# Patient Record
Sex: Male | Born: 1970 | Race: White | Hispanic: No | Marital: Married | Smoking: Never smoker
Health system: Southern US, Community
[De-identification: ages and names within clinical notes are randomized; demographics above are authoritative.]

## PROBLEM LIST (undated history)

## (undated) DIAGNOSIS — E785 Hyperlipidemia, unspecified: Secondary | ICD-10-CM

## (undated) DIAGNOSIS — I10 Essential (primary) hypertension: Secondary | ICD-10-CM

## (undated) DIAGNOSIS — E119 Type 2 diabetes mellitus without complications: Secondary | ICD-10-CM

---

## 2005-01-08 ENCOUNTER — Ambulatory Visit: Payer: Self-pay | Admitting: Family Medicine

## 2005-01-15 ENCOUNTER — Ambulatory Visit: Payer: Self-pay | Admitting: Family Medicine

## 2021-12-20 ENCOUNTER — Other Ambulatory Visit: Payer: Self-pay

## 2021-12-20 ENCOUNTER — Encounter (HOSPITAL_BASED_OUTPATIENT_CLINIC_OR_DEPARTMENT_OTHER): Payer: Self-pay

## 2021-12-20 ENCOUNTER — Emergency Department (HOSPITAL_BASED_OUTPATIENT_CLINIC_OR_DEPARTMENT_OTHER): Payer: BC Managed Care – PPO

## 2021-12-20 ENCOUNTER — Observation Stay (HOSPITAL_BASED_OUTPATIENT_CLINIC_OR_DEPARTMENT_OTHER)
Admission: EM | Admit: 2021-12-20 | Discharge: 2021-12-21 | Disposition: A | Discharge status: Home / Self Care | Payer: BC Managed Care – PPO | Attending: Internal Medicine | Admitting: Internal Medicine

## 2021-12-20 DIAGNOSIS — E785 Hyperlipidemia, unspecified: Secondary | ICD-10-CM | POA: Diagnosis not present

## 2021-12-20 DIAGNOSIS — I639 Cerebral infarction, unspecified: Principal | ICD-10-CM | POA: Insufficient documentation

## 2021-12-20 DIAGNOSIS — E1169 Type 2 diabetes mellitus with other specified complication: Secondary | ICD-10-CM | POA: Insufficient documentation

## 2021-12-20 DIAGNOSIS — I10 Essential (primary) hypertension: Secondary | ICD-10-CM | POA: Insufficient documentation

## 2021-12-20 DIAGNOSIS — R200 Anesthesia of skin: Secondary | ICD-10-CM | POA: Diagnosis present

## 2021-12-20 DIAGNOSIS — E1159 Type 2 diabetes mellitus with other circulatory complications: Secondary | ICD-10-CM | POA: Diagnosis present

## 2021-12-20 DIAGNOSIS — E119 Type 2 diabetes mellitus without complications: Secondary | ICD-10-CM

## 2021-12-20 HISTORY — DX: Essential (primary) hypertension: I10

## 2021-12-20 HISTORY — DX: Hyperlipidemia, unspecified: E78.5

## 2021-12-20 HISTORY — DX: Type 2 diabetes mellitus without complications: E11.9

## 2021-12-20 LAB — ETHANOL: Alcohol, Ethyl (B): <10 mg/dL (ref <10)

## 2021-12-20 LAB — COMPREHENSIVE METABOLIC PANEL
ALT: 55 U/L — ABNORMAL HIGH (ref 0–44)
AST: 32 U/L (ref 15–41)
Albumin: 4.2 g/dL (ref 3.5–5.0)
Alkaline Phosphatase: 54 U/L (ref 38–126)
Anion gap: 8 (ref 5–15)
BUN: 17 mg/dL (ref 6–20)
CO2: 23 mmol/L (ref 22–32)
Calcium: 9.3 mg/dL (ref 8.9–10.3)
Chloride: 103 mmol/L (ref 98–111)
Creatinine, Ser: 0.79 mg/dL (ref 0.61–1.24)
GFR, Estimated: >60 mL/min (ref >60)
Glucose, Bld: 150 mg/dL — ABNORMAL HIGH (ref 70–99)
Potassium: 4.1 mmol/L (ref 3.5–5.1)
Sodium: 134 mmol/L — ABNORMAL LOW (ref 135–145)
Total Bilirubin: 1 mg/dL (ref 0.3–1.2)
Total Protein: 6.9 g/dL (ref 6.5–8.1)

## 2021-12-20 LAB — URINALYSIS, ROUTINE W REFLEX MICROSCOPIC
Bilirubin Urine: NEGATIVE
Glucose, UA: 250 mg/dL — AB
Hgb urine dipstick: NEGATIVE
Ketones, ur: NEGATIVE mg/dL
Leukocytes,Ua: NEGATIVE
Nitrite: NEGATIVE
Protein, ur: NEGATIVE mg/dL
Specific Gravity, Urine: 1.02 (ref 1.005–1.030)
pH: 5.5 (ref 5.0–8.0)

## 2021-12-20 LAB — PROTIME-INR
INR: 1.1 (ref 0.8–1.2)
Prothrombin Time: 13.9 seconds (ref 11.4–15.2)

## 2021-12-20 LAB — DIFFERENTIAL
Abs Immature Granulocytes: 0.01 10*3/uL (ref 0.00–0.07)
Basophils Absolute: 0 10*3/uL (ref 0.0–0.1)
Basophils Relative: 1 %
Eosinophils Absolute: 0.2 10*3/uL (ref 0.0–0.5)
Eosinophils Relative: 3 %
Immature Granulocytes: 0 %
Lymphocytes Relative: 18 %
Lymphs Abs: 1 10*3/uL (ref 0.7–4.0)
Monocytes Absolute: 0.3 10*3/uL (ref 0.1–1.0)
Monocytes Relative: 6 %
Neutro Abs: 3.9 10*3/uL (ref 1.7–7.7)
Neutrophils Relative %: 72 %
Smear Review: NORMAL

## 2021-12-20 LAB — RAPID URINE DRUG SCREEN, HOSP PERFORMED
Amphetamines: NOT DETECTED
Barbiturates: NOT DETECTED
Benzodiazepines: NOT DETECTED
Cocaine: NOT DETECTED
Opiates: NOT DETECTED
Tetrahydrocannabinol: NOT DETECTED

## 2021-12-20 LAB — CBC
Hemoglobin: 16.5 g/dL (ref 13.0–17.0)
Platelets: 157 10*3/uL (ref 150–400)
WBC: 5.4 10*3/uL (ref 4.0–10.5)

## 2021-12-20 LAB — APTT: aPTT: 29 seconds (ref 24–36)

## 2021-12-20 LAB — CBG MONITORING, ED: Glucose-Capillary: 140 mg/dL — ABNORMAL HIGH (ref 70–99)

## 2021-12-20 MED ORDER — ASPIRIN 325 MG PO TABS
325.0000 mg | ORAL_TABLET | Freq: Every day | ORAL | Status: DC
  Administered 2021-12-20: 325 mg via ORAL
  Filled 2021-12-20: qty 1

## 2021-12-20 MED ORDER — CLOPIDOGREL BISULFATE 75 MG PO TABS
75.0000 mg | ORAL_TABLET | Freq: Once | ORAL | Status: AC
  Administered 2021-12-20: 75 mg via ORAL
  Filled 2021-12-20: qty 1

## 2021-12-20 NOTE — Progress Notes (Signed)
Pt arrived to the unit via stretcher with two carelink staff. No distress noted. Oral membranes are pink and moist. Pupils are PERRLA. Skin warm and dry. Respirations are even and non-labored. S1 and s2 noted. Peripheral pulses are +2.Abdomen soft, non-tender. Bowel sounds noted. Move all extremities without difficulty. Steady gait noted. Bed in lowest position. Call light within reach. ?

## 2021-12-20 NOTE — ED Triage Notes (Signed)
C/o right sided facial and arm tingling starting Sunday. Been increasingly fatigued. States slipped outside and fell on to left shoulder on Sunday prior to when tingling started. Also states when he drinks, fluid comes out of mouth, denies trouble swallowing.  ?

## 2021-12-20 NOTE — ED Notes (Signed)
[facility] Resource Line called to request phone consult for ED MD from [facility] ?

## 2021-12-20 NOTE — Progress Notes (Signed)
[facility] hospitalist paged regarding pt's arriival to the unit. ?

## 2021-12-20 NOTE — Progress Notes (Signed)
Plan of Care Note for accepted transfer ? ? ?Patient: Riley Shea MRN: [id number]   DOA: 12/20/2021 ? ?Facility requesting transfer: [facility] ED ?Requesting Provider: Dr. Regenia Skeeter, Nicki Reaper ?Reason for transfer: higher level of care ?Facility course: stroke workup ? ?Plan of care: ?The patient is accepted for admission to Telemetry unit, at [facility]..  ?Patient is a 51 years old man with a PMH of T2DM, HTN, HLD who presented with facial droop x 2 days and right arm numbness today.  No other neurological symptoms. VSS. ETOH negative.  Head CT negative for acute changes. ED spoke to neurology Dr. Quinn Axe who recommended to transfer to [facility], give ASA and Plavix. Patient is accepted to [facility] service, med tele inpatient. Please consult neurology when patient arrives.  ? ?Author: ?Charlann Lange, MD ?12/20/2021 ? ?Check [email or web address] for on-call coverage. ? ?Nursing staff, Please call [facility] number on Amion as soon as patient's arrival, so appropriate admitting provider can evaluate the pt. ?

## 2021-12-20 NOTE — ED Notes (Signed)
Report given to [facility] 

## 2021-12-20 NOTE — H&P (Signed)
?History and Physical  ? ? Riley Shea [id number] DOB: 18-Nov-1970 DOA: 12/20/2021 ? ?PCP: Alferd Apa, MD  ?Patient coming from: Home ? ?I have personally briefly reviewed patient's old medical records in [facility] ? ?Chief Complaint: Right facial and arm numbness/tingling ? ?HPI: ?Riley Shea is a 51 y.o. male with medical history significant for type 2 diabetes, HTN, HLD who presented to the ED for evaluation of right facial and arm numbness/tingling. ? ?Patient states night of 4/9 while he was brushing his teeth he noticed that water was following out the right side of his mouth.  He felt that the right side of his mouth appeared to be slightly weaker.  He did not think much of it.  The next day he developed new numbness going on from his right hand to his right shoulder as well as the right side of his face from the eyelid down.  This numbness has been constant.  He has not had any associated extremity weakness.  He does report a recent fall onto his left shoulder but no injury to his right shoulder.  He does not take any antiplatelet at baseline. ? ?[facility] ED Course  Labs/Imaging on admission: I have personally reviewed following labs and imaging studies. ? ?Initial vitals showed BP 124/91, pulse 72, RR 18, temp 98.0 ?F, SPO2 98% on room air. ? ?Labs showed sodium 134, potassium 4.1, bicarb 23, BUN 17, creatinine 0.79, serum glucose 150, WBC 5.4, hemoglobin 16.5, platelets 157,000. ? ?CT head without contrast was negative. ? ?EDP discussed with on-call neurology, Dr. Selina Cooley, who recommended aspirin 325 mg, Plavix 75 mg and medical admission for further CVA evaluation.  The hospitalist service was consulted to admit for further evaluation and management. ? ?Review of Systems: All systems reviewed and are negative except as documented in history of present illness above. ? ? ?Past Medical History:  ?Diagnosis Date  ? Diabetes mellitus without complication (HCC)   ? Hyperlipidemia   ?  Hypertension   ? ? ?History reviewed. No pertinent surgical history. ? ?Social History: ? reports that he has never smoked. He has never used smokeless tobacco. He reports that he does not drink alcohol and does not use drugs. ? ?Allergies  ?Allergen Reactions  ? Penicillins   ?  Other reaction(s): Other ?As a Child  ? ? ?History reviewed. No pertinent family history. ? ? ?Prior to Admission medications   ?Not on File  ? ? ?Physical Exam: ?Vitals:  ? 12/20/21 2139 12/20/21 2200 12/20/21 2315 12/21/21 0000  ?BP: 126/86 128/90  125/89  ?Pulse: 72 70  75  ?Resp: 14 14    ?Temp: 98 ?F (36.7 ?C) 98 ?F (36.7 ?C)  98.6 ?F (37 ?C)  ?TempSrc: Oral Oral  Oral  ?SpO2: 97% 99%  95%  ?Weight:   87.5 kg   ?Height:   5\' 10"  (1.778 m)   ? ?Constitutional: NAD, calm, comfortable ?Eyes: PERRL, lids and conjunctivae normal ?ENMT: Mucous membranes are moist. Posterior pharynx clear of any exudate or lesions.Normal dentition.  ?Neck: normal, supple, no masses. ?Respiratory: clear to auscultation bilaterally, no wheezing, no crackles. Normal respiratory effort. No accessory muscle use.  ?Cardiovascular: Regular rate and rhythm, no murmurs / rubs / gallops. No extremity edema. 2+ pedal pulses. ?Abdomen: no tenderness, no masses palpated. No hepatosplenomegaly. Bowel sounds positive.  ?Musculoskeletal: no clubbing / cyanosis. No joint deformity upper and lower extremities. Good ROM, no contractures. Normal muscle tone.  ?Skin: no rashes, lesions,  ulcers. No induration ?Neurologic: CN 2-12 grossly intact. Sensation diminished right face. Strength 5/5 in all 4.  ?Psychiatric: Normal judgment and insight. Alert and oriented x 3. Normal mood.  ? ?EKG: Personally reviewed. Normal sinus rhythm without acute ischemic changes, borderline prolonged PR interval.  No prior for comparison. ? ?Assessment/Plan ?Principal Problem: ?  Right sided numbness ?Active Problems: ?  Type 2 diabetes mellitus (HCC) ?  Hypertension associated with diabetes (HCC) ?   Hyperlipidemia associated with type 2 diabetes mellitus (HCC) ?  ?Riley Shea is a 51 y.o. male with medical history significant for type 2 diabetes, HTN, HLD who is admitted for evaluation of right facial and arm numbness. ? ?Assessment and Plan: ?* Right sided numbness ?Patient with new onset right facial and right arm numbness evening of 4/9.  Admitted for CVA work-up.  CT head negative. ?-Neurology to follow ?-Obtain MRI brain -> further imaging if positive for stroke ?-Continue aspirin 81 mg daily ?-Continue home rosuvastatin 20 mg daily ?-PT/OT/SLP eval ?-Out of permissive hypertension window ?-Check A1c, lipid panel ? ?Type 2 diabetes mellitus (HCC) ?Hold home oral meds, placed on SSI. ? ?Hypertension associated with diabetes (HCC) ?Resume home lisinopril 10 mg daily. ? ?Hyperlipidemia associated with type 2 diabetes mellitus (HCC) ?Continue rosuvastatin 20 mg daily. ? ?DVT prophylaxis: enoxaparin (LOVENOX) injection 40 mg Start: 12/21/21 1000 ?Code Status: Full code, confirmed on admission ?Family Communication: Discussed with patient, he has discussed with family ?Disposition Plan: From home, dispo pending clinical progress ?Consults called: Neurology ?Severity of Illness: ?The appropriate patient status for this patient is OBSERVATION. Observation status is judged to be reasonable and necessary in order to provide the required intensity of service to ensure the patient's safety. The patient's presenting symptoms, physical exam findings, and initial radiographic and laboratory data in the context of their medical condition is felt to place them at decreased risk for further clinical deterioration. Furthermore, it is anticipated that the patient will be medically stable for discharge from the hospital within 2 midnights of admission.   ?Darreld Mclean MD ?[facility] ? ?If 7PM-7AM, please contact night-coverage ?[email or web address] ? ?12/21/2021, 1:24 AM  ?

## 2021-12-20 NOTE — ED Provider Notes (Signed)
?[facility] EMERGENCY DEPARTMENT ?Provider Note ? ? ?CSN: [id number] ?Arrival date & time: 12/20/21  1055 ? ?  ? ?History ? ?Chief Complaint  ?Patient presents with  ? Tingling  ? ? ?Riley Shea is a 51 y.o. male. ? ?HPI ?51 year old male with a history of hypertension, hyperlipidemia, and poorly controlled diabetes presents with numbness and tingling.  Symptoms overall started 3 days ago.  Primarily was having facial numbness and feeling like things were going outside the right side of his mouth.  He was trying to spit straight and it would come out the right side.  No headache.  Today he noticed that in addition to the symptoms he also has right arm numbness/tingling.  Is primarily down the ulnar side of his arm but is diffuse throughout his arm.  No weakness.  He is concerned about a possible stroke.  He occasionally has blurry vision but not with this illness and no trouble closing his eyes. ? ?Home Medications ?Prior to Admission medications   ?Not on File  ?   ? ?Allergies    ?Penicillins   ? ?Review of Systems   ?Review of Systems  ?Eyes:  Negative for visual disturbance.  ?Neurological:  Positive for numbness. Negative for speech difficulty, weakness and headaches.  ? ?Physical Exam ?Updated Vital Signs ?BP 124/89   Pulse 74   Temp 98 ?F (36.7 ?C) (Oral)   Resp 20   Ht 5\' 10"  (1.778 m)   Wt 91.2 kg   SpO2 98%   BMI 28.84 kg/m?  ?Physical Exam ?Vitals and nursing note reviewed.  ?Constitutional:   ?   General: He is not in acute distress. ?   Appearance: He is well-developed. He is not ill-appearing or diaphoretic.  ?HENT:  ?   Head: Normocephalic and atraumatic.  ?Eyes:  ?   Extraocular Movements: Extraocular movements intact.  ?   Pupils: Pupils are equal, round, and reactive to light.  ?Cardiovascular:  ?   Rate and Rhythm: Normal rate and regular rhythm.  ?   Heart sounds: Normal heart sounds.  ?Pulmonary:  ?   Effort: Pulmonary effort is normal.  ?   Breath sounds: Normal breath sounds.   ?Abdominal:  ?   General: There is no distension.  ?Skin: ?   General: Skin is warm and dry.  ?Neurological:  ?   Mental Status: He is alert.  ?   Comments: When smiling, seems to have a slight right-sided facial droop.  He has normal forehead movement, normal strength when closing his eyelids, and normal speech.  He does endorse some subjective decrease sensation to the right face from forehead down to chin.  Otherwise he has 5/5 strength in all 4 extremities and normal sensation throughout, including his right forearm and hand.  ? ? ?ED Results / Procedures / Treatments   ?Labs ?(all labs ordered are listed, but only abnormal results are displayed) ?Labs Reviewed  ?COMPREHENSIVE METABOLIC PANEL - Abnormal; Notable for the following components:  ?    Result Value  ? Sodium 134 (*)   ? Glucose, Bld 150 (*)   ? ALT 55 (*)   ? All other components within normal limits  ?CBG MONITORING, ED - Abnormal; Notable for the following components:  ? Glucose-Capillary 140 (*)   ? All other components within normal limits  ?ETHANOL  ?PROTIME-INR  ?APTT  ?CBC  ?DIFFERENTIAL  ?RAPID URINE DRUG SCREEN, HOSP PERFORMED  ?URINALYSIS, ROUTINE W REFLEX MICROSCOPIC  ? ? ?EKG ?  EKG Interpretation ? ?Date/Time:  Wednesday December 20 2021 11:35:09 EDT ?Ventricular Rate:  73 ?PR Interval:  207 ?QRS Duration: 79 ?QT Interval:  354 ?QTC Calculation: 390 ?R Axis:   70 ?Text Interpretation: Sinus rhythm Borderline prolonged PR interval Low voltage, extremity and precordial leads No old tracing to compare Confirmed by Pricilla Loveless [phone]) on 12/20/2021 12:08:20 PM ? ?Radiology ?CT HEAD WO CONTRAST ? ?Result Date: 12/20/2021 ?CLINICAL DATA:  Neuro deficit, acute, stroke suspected. Right-sided facial tingling. Right arm tingling. Symptoms for 3 days. EXAM: CT HEAD WITHOUT CONTRAST TECHNIQUE: Contiguous axial images were obtained from the base of the skull through the vertex without intravenous contrast. RADIATION DOSE REDUCTION: This exam was  performed according to the departmental dose-optimization program which includes automated exposure control, adjustment of the mA and/or kV according to patient size and/or use of iterative reconstruction technique. COMPARISON:  None. FINDINGS: Brain: No acute infarct, hemorrhage, or mass lesion is present. No significant white matter lesions are present. The ventricles are of normal size. No significant extraaxial fluid collection is present. The brainstem and cerebellum are within normal limits. Vascular: No hyperdense vessel or unexpected calcification. Skull: Calvarium is intact. No focal lytic or blastic lesions are present. No significant extracranial soft tissue lesion is present. Sinuses/Orbits: The paranasal sinuses and mastoid air cells are clear. The globes and orbits are within normal limits. IMPRESSION: Negative CT of the head. Electronically Signed   By: Marin Roberts M.D.   On: 12/20/2021 11:57   ? ?Procedures ?Procedures  ? ? ?Medications Ordered in ED ?Medications  ?aspirin tablet 325 mg (325 mg Oral Given 12/20/21 1251)  ?clopidogrel (PLAVIX) tablet 75 mg (75 mg Oral Given 12/20/21 1251)  ? ? ?ED Course/ Medical Decision Making/ A&P ?  ?                        ?Medical Decision Making ?Amount and/or Complexity of Data Reviewed ?Labs: ordered. ?Radiology: ordered. ? ?Risk ?OTC drugs. ?Prescription drug management. ?Decision regarding hospitalization. ? ? ?Patient presents with right-sided face and arm numbness. Chart reviewed, he has poorly controlled diabetes.  However today his glucose is okay at around 150.  CT head images personally reviewed and there is no edema or infarct.  Labs are otherwise benign besides slight sodium of 134 and ALT of 55.  ECG without acute ischemia.  I discussed his case with Dr. Selina Cooley, neurology, who recommends 325 mg aspirin, 75 mg Plavix and admission to [facility] for stroke work-up. D/w Dr. Dierdre Searles, hospitalist, who will admit. ? ? ? ? ? ? ? ?Final Clinical  Impression(s) / ED Diagnoses ?Final diagnoses:  ?Right sided numbness  ? ? ?Rx / DC Orders ?ED Discharge Orders   ? ? None  ? ?  ? ? ?  ?Pricilla Loveless, MD ?12/20/21 1526 ? ?

## 2021-12-21 ENCOUNTER — Inpatient Hospital Stay (HOSPITAL_BASED_OUTPATIENT_CLINIC_OR_DEPARTMENT_OTHER): Payer: BC Managed Care – PPO

## 2021-12-21 ENCOUNTER — Inpatient Hospital Stay (HOSPITAL_COMMUNITY): Payer: BC Managed Care – PPO

## 2021-12-21 ENCOUNTER — Other Ambulatory Visit (HOSPITAL_COMMUNITY): Payer: Self-pay

## 2021-12-21 DIAGNOSIS — I6389 Other cerebral infarction: Secondary | ICD-10-CM | POA: Diagnosis not present

## 2021-12-21 DIAGNOSIS — R200 Anesthesia of skin: Secondary | ICD-10-CM | POA: Diagnosis not present

## 2021-12-21 DIAGNOSIS — E119 Type 2 diabetes mellitus without complications: Secondary | ICD-10-CM | POA: Diagnosis not present

## 2021-12-21 DIAGNOSIS — I639 Cerebral infarction, unspecified: Secondary | ICD-10-CM | POA: Diagnosis present

## 2021-12-21 LAB — CBC
HCT: 43.5 % (ref 39.0–52.0)
Hemoglobin: 16.3 g/dL (ref 13.0–17.0)
MCH: 33.5 pg (ref 26.0–34.0)
MCHC: 37.5 g/dL — ABNORMAL HIGH (ref 30.0–36.0)
MCV: 89.3 fL (ref 80.0–100.0)
Platelets: 159 10*3/uL (ref 150–400)
RBC: 4.87 MIL/uL (ref 4.22–5.81)
RDW: 11.8 % (ref 11.5–15.5)
WBC: 4.8 10*3/uL (ref 4.0–10.5)
nRBC: 0 % (ref 0.0–0.2)

## 2021-12-21 LAB — GLUCOSE, CAPILLARY
Glucose-Capillary: 179 mg/dL — ABNORMAL HIGH (ref 70–99)
Glucose-Capillary: 266 mg/dL — ABNORMAL HIGH (ref 70–99)

## 2021-12-21 LAB — BASIC METABOLIC PANEL
Anion gap: 11 (ref 5–15)
BUN: 15 mg/dL (ref 6–20)
CO2: 22 mmol/L (ref 22–32)
Calcium: 9.4 mg/dL (ref 8.9–10.3)
Chloride: 106 mmol/L (ref 98–111)
Creatinine, Ser: 0.83 mg/dL (ref 0.61–1.24)
GFR, Estimated: >60 mL/min (ref >60)
Glucose, Bld: 211 mg/dL — ABNORMAL HIGH (ref 70–99)
Potassium: 3.9 mmol/L (ref 3.5–5.1)
Sodium: 139 mmol/L (ref 135–145)

## 2021-12-21 LAB — ECHOCARDIOGRAM COMPLETE
AR max vel: 3.14 cm2
AV Area VTI: 2.98 cm2
AV Area mean vel: 3.09 cm2
AV Mean grad: 4 mmHg
AV Peak grad: 7.1 mmHg
Ao pk vel: 1.33 m/s
Area-P 1/2: 3.2 cm2
Height: 70 in
S' Lateral: 2.6 cm
Weight: 3088 oz

## 2021-12-21 LAB — MAGNESIUM: Magnesium: 2 mg/dL (ref 1.7–2.4)

## 2021-12-21 LAB — LIPID PANEL
Cholesterol: 112 mg/dL (ref 0–200)
HDL: 23 mg/dL — ABNORMAL LOW (ref >40)
LDL Cholesterol: 13 mg/dL (ref 0–99)
Total CHOL/HDL Ratio: 4.9 RATIO
Triglycerides: 382 mg/dL — ABNORMAL HIGH (ref <150)
VLDL: 76 mg/dL — ABNORMAL HIGH (ref 0–40)

## 2021-12-21 LAB — HEMOGLOBIN A1C
Hgb A1c MFr Bld: 7.8 % — ABNORMAL HIGH (ref 4.8–5.6)
Mean Plasma Glucose: 177.16 mg/dL

## 2021-12-21 LAB — HIV ANTIBODY (ROUTINE TESTING W REFLEX): HIV Screen 4th Generation wRfx: NONREACTIVE

## 2021-12-21 MED ORDER — FENOFIBRATE 54 MG PO TABS
54.0000 mg | ORAL_TABLET | Freq: Every day | ORAL | Status: DC
  Administered 2021-12-21: 54 mg via ORAL
  Filled 2021-12-21: qty 1

## 2021-12-21 MED ORDER — INSULIN ASPART 100 UNIT/ML IJ SOLN
0.0000 [IU] | Freq: Three times a day (TID) | INTRAMUSCULAR | Status: DC
  Administered 2021-12-21: 5 [IU] via SUBCUTANEOUS
  Administered 2021-12-21: 2 [IU] via SUBCUTANEOUS

## 2021-12-21 MED ORDER — ENOXAPARIN SODIUM 40 MG/0.4ML IJ SOSY
40.0000 mg | PREFILLED_SYRINGE | INTRAMUSCULAR | Status: DC
  Administered 2021-12-21: 40 mg via SUBCUTANEOUS
  Filled 2021-12-21: qty 0.4

## 2021-12-21 MED ORDER — ROSUVASTATIN CALCIUM 20 MG PO TABS
20.0000 mg | ORAL_TABLET | Freq: Every day | ORAL | Status: DC
  Administered 2021-12-21: 20 mg via ORAL
  Filled 2021-12-21: qty 1

## 2021-12-21 MED ORDER — CLOPIDOGREL BISULFATE 75 MG PO TABS: 75.0000 mg | ORAL_TABLET | Freq: Every day | ORAL | Status: DC

## 2021-12-21 MED ORDER — ASPIRIN 81 MG PO TBEC
81.0000 mg | DELAYED_RELEASE_TABLET | Freq: Every day | ORAL | 11 refills | Status: AC
Start: 2021-12-22 — End: ?
  Filled 2021-12-21: qty 30, 30d supply, fill #0

## 2021-12-21 MED ORDER — ACETAMINOPHEN 325 MG PO TABS: 650.0000 mg | ORAL_TABLET | ORAL | Status: DC | PRN

## 2021-12-21 MED ORDER — CLOPIDOGREL BISULFATE 75 MG PO TABS
75.0000 mg | ORAL_TABLET | Freq: Every day | ORAL | 0 refills | Status: AC
  Filled 2021-12-21: qty 20, 20d supply, fill #0

## 2021-12-21 MED ORDER — CLOPIDOGREL BISULFATE 75 MG PO TABS
225.0000 mg | ORAL_TABLET | Freq: Once | ORAL | Status: AC
  Administered 2021-12-21: 225 mg via ORAL
  Filled 2021-12-21: qty 3

## 2021-12-21 MED ORDER — SENNOSIDES-DOCUSATE SODIUM 8.6-50 MG PO TABS: 1.0000 | ORAL_TABLET | Freq: Every evening | ORAL | Status: DC | PRN

## 2021-12-21 MED ORDER — STROKE: EARLY STAGES OF RECOVERY BOOK
Freq: Once | Status: AC
  Filled 2021-12-21: qty 1

## 2021-12-21 MED ORDER — ACETAMINOPHEN 160 MG/5ML PO SOLN: 650.0000 mg | ORAL | Status: DC | PRN

## 2021-12-21 MED ORDER — IOHEXOL 350 MG/ML SOLN
75.0000 mL | Freq: Once | INTRAVENOUS | Status: AC | PRN
  Administered 2021-12-21: 75 mL via INTRAVENOUS

## 2021-12-21 MED ORDER — FENOFIBRATE 54 MG PO TABS
54.0000 mg | ORAL_TABLET | Freq: Every day | ORAL | 0 refills | Status: AC
  Filled 2021-12-21: qty 30, 30d supply, fill #0

## 2021-12-21 MED ORDER — ASPIRIN EC 81 MG PO TBEC
81.0000 mg | DELAYED_RELEASE_TABLET | Freq: Every day | ORAL | Status: DC
Start: 2021-12-21 — End: 2021-12-21
  Administered 2021-12-21: 81 mg via ORAL
  Filled 2021-12-21: qty 1

## 2021-12-21 MED ORDER — LISINOPRIL 10 MG PO TABS
10.0000 mg | ORAL_TABLET | Freq: Every day | ORAL | Status: DC
  Administered 2021-12-21: 10 mg via ORAL
  Filled 2021-12-21: qty 1

## 2021-12-21 MED ORDER — ACETAMINOPHEN 650 MG RE SUPP: 650.0000 mg | RECTAL | Status: DC | PRN

## 2021-12-21 NOTE — Assessment & Plan Note (Signed)
Patient with new onset right facial and right arm numbness evening of 4/9.  Admitted for CVA work-up.  CT head negative. ?-Neurology to follow ?-Obtain MRI brain -> further imaging if positive for stroke ?-Continue aspirin 81 mg daily ?-Continue home rosuvastatin 20 mg daily ?-PT/OT/SLP eval ?-Out of permissive hypertension window ?-Check A1c, lipid panel ?

## 2021-12-21 NOTE — Evaluation (Signed)
Physical Therapy Evaluation ?Patient Details ?Name: Riley Shea ?MRN: [id number] ?DOB: 1971/01/28 ?Today's Date: 12/21/2021 ? ?History of Present Illness ? Riley Shea is a 51 y.o. male with medical history significant for type 2 diabetes, HTN, HLD who presented to the ED for evaluation of right facial and arm numbness/tingling.  ?Clinical Impression ? Pt is at or close to baseline functioning and should be safe at home with PRN assist from family. There are no further acute PT needs.  Will sign off at this time. ?   ?   ? ?Recommendations for follow up therapy are one component of a multi-disciplinary discharge planning process, led by the attending physician.  Recommendations may be updated based on patient status, additional functional criteria and insurance authorization. ? ?Follow Up Recommendations No PT follow up ? ?  ?Assistance Recommended at Discharge PRN  ?Patient can return home with the following ?   ? ?  ?Equipment Recommendations None recommended by PT  ?Recommendations for Other Services ?    ?  ?Functional Status Assessment Patient has had a recent decline in their functional status and demonstrates the ability to make significant improvements in function in a reasonable and predictable amount of time.  ? ?  ?Precautions / Restrictions Precautions ?Precautions: None  ? ?  ? ?Mobility ? Bed Mobility ?Overal bed mobility: Independent ?  ?  ?  ?  ?  ?  ?  ?  ? ?Transfers ?Overall transfer level: Independent ?  ?  ?  ?  ?  ?  ?  ?  ?  ?  ? ?Ambulation/Gait ?Ambulation/Gait assistance: Independent ?Gait Distance (Feet): 500 Feet ?Assistive device: None ?Gait Pattern/deviations: WFL(Within Functional Limits) ?  ?Gait velocity interpretation: >4.37 ft/sec, indicative of normal walking speed ?  ?General Gait Details: age appropriate speeds, ? ?Stairs ?Stairs: Yes ?Stairs assistance: Independent ?Stair Management: No rails ?Number of Stairs: 10 ?General stair comments: safe ? ?Wheelchair Mobility ?   ? ?Modified Rankin (Stroke Patients Only) ?  ? ?  ? ?Balance   ?  ?  ?  ?  ?  ?  ?  ?  ?  ?  ?  ?  ?  ?  ?  ?Standardized Balance Assessment ?Standardized Balance Assessment : Dynamic Gait Index ?  ?Dynamic Gait Index ?Level Surface: Normal ?Change in Gait Speed: Normal ?Gait with Horizontal Head Turns: Normal ?Gait with Vertical Head Turns: Normal ?Gait and Pivot Turn: Normal ?Step Over Obstacle: Normal ?Step Around Obstacles: Normal ?Steps: Normal ?Total Score: 24 ?   ? ? ? ?Pertinent Vitals/Pain Pain Assessment ?Pain Assessment: No/denies pain  ? ? ?Home Living Family/patient expects to be discharged to:: Private residence ?Living Arrangements: Spouse/significant other ?Available Help at Discharge: Family;Available PRN/intermittently ?Type of Home: House ?Home Access: Stairs to enter ?  ?Entrance Stairs-Number of Steps: 5 ?  ?Home Layout: Multi-level;Able to live on main level with bedroom/bathroom ?Home Equipment: None ?   ?  ?Prior Function Prior Level of Function : Independent/Modified Independent ?  ?  ?  ?  ?  ?  ?Mobility Comments: independent -truck driver, short distance ?  ?  ? ? ?Hand Dominance  ? Dominant Hand: Right ? ?  ?Extremity/Trunk Assessment  ? Upper Extremity Assessment ?Upper Extremity Assessment: Overall WFL for tasks assessed ?RUE Deficits / Details: WNL ROM. 5/5 shoulder, 4+/5 bicep, wrist 4+/5, 5/5 grip ?RUE Sensation: WNL ?RUE Coordination: WNL ?LUE Deficits / Details: WNL ROM and 5/5 strength ?LUE Sensation: WNL ?LUE Coordination: WNL ?  ? ?  Lower Extremity Assessment ?Lower Extremity Assessment: Overall WFL for tasks assessed ?  ? ?Cervical / Trunk Assessment ?Cervical / Trunk Assessment: Normal  ?Communication  ? Communication: No difficulties  ?Cognition Arousal/Alertness: Awake/alert ?Behavior During Therapy: [facility] for tasks assessed/performed ?Overall Cognitive Status: Within Functional Limits for tasks assessed ?  ?  ?  ?  ?  ?  ?  ?  ?  ?  ?  ?  ?  ?  ?  ?  ?  ?  ?  ? ?  ?General  Comments   ? ?  ?Exercises    ? ?Assessment/Plan  ?  ?PT Assessment Patient does not need any further PT services  ?PT Problem List   ? ?   ?  ?PT Treatment Interventions     ? ?PT Goals (Current goals can be found in the Care Plan section)  ?Acute Rehab PT Goals ?PT Goal Formulation: All assessment and education complete, DC therapy ? ?  ?Frequency   ?  ? ? ?Co-evaluation   ?  ?  ?  ?  ? ? ?  ?AM-PAC PT "6 Clicks" Mobility  ?Outcome Measure Help needed turning from your back to your side while in a flat bed without using bedrails?: None ?Help needed moving from lying on your back to sitting on the side of a flat bed without using bedrails?: None ?Help needed moving to and from a bed to a chair (including a wheelchair)?: None ?Help needed standing up from a chair using your arms (e.g., wheelchair or bedside chair)?: None ?Help needed to walk in hospital room?: None ?Help needed climbing 3-5 steps with a railing? : None ?6 Click Score: 24 ? ?  ?End of Session   ?Activity Tolerance: Patient tolerated treatment well ?Patient left: Other (comment) (hall traveling down the hall with the doctors.) ?Nurse Communication: Mobility status ?  ?  ? ?Time: [id number] ?PT Time Calculation (min) (ACUTE ONLY): 17 min ? ? ?Charges:   PT Evaluation ?$PT Eval Low Complexity: 1 Low ?  ?  ?   ? ? ?12/21/2021 ? ?Ginger Carne., PT ?Acute Rehabilitation Services ?[phone]  (pager) ?[phone]  (office) ? ?Tessie Fass Freda Jaquith ?12/21/2021, 1:05 PM ? ?

## 2021-12-21 NOTE — Hospital Course (Signed)
Riley Shea is a 51 y.o. male with medical history significant for type 2 diabetes, HTN, HLD who is admitted for evaluation of right facial and arm numbness. ?

## 2021-12-21 NOTE — Progress Notes (Addendum)
STROKE TEAM PROGRESS NOTE   INTERVAL HISTORY Patient is seen  in his room  with his wife at the bedside.  On Sunday, he noticed some transient facial numbness which improved but yesterday he developed sudden onset right arm numbness as well as facial weakness with liquids dripping out of the right side of his mouth while he was brushing his teeth.  He was noted to have right facial droop by ER physician but by the time he was seen by neuro hospitalist it had improved numbness has improved also.    MRI did not show an acute stroke.  Vitals:   12/21/21 0455 12/21/21 0543 12/21/21 0823 12/21/21 1215  BP: 132/90 123/84 125/89 120/90  Pulse: 74 74 72 77  Resp:   18 17  Temp: 98.5 F (36.9 C) 98 F (36.7 C) 97.7 F (36.5 C) 98.3 F (36.8 C)  TempSrc: Oral Oral Oral Oral  SpO2: 98% 100%    Weight:      Height:       CBC:  Recent Labs  Lab 12/20/21 1135 12/21/21 0152  WBC 5.4 4.8  NEUTROABS 3.9  --   HGB 16.5 16.3  HCT RESULTS UNAVAILABLE DUE TO INTERFERING SUBSTANCE 43.5  MCV RESULTS UNAVAILABLE DUE TO INTERFERING SUBSTANCE 89.3  PLT 157 159   Basic Metabolic Panel:  Recent Labs  Lab 12/20/21 1135 12/21/21 0152  NA 134* 139  K 4.1 3.9  CL 103 106  CO2 23 22  GLUCOSE 150* 211*  BUN 17 15  CREATININE 0.79 0.83  CALCIUM 9.3 9.4  MG  --  2.0   Lipid Panel:  Recent Labs  Lab 12/21/21 0152  CHOL 112  TRIG 382*  HDL 23*  CHOLHDL 4.9  VLDL 76*  LDLCALC 13   HgbA1c:  Recent Labs  Lab 12/21/21 0152  HGBA1C 7.8*   Urine Drug Screen:  Recent Labs  Lab 12/20/21 1557  LABOPIA NONE DETECTED  COCAINSCRNUR NONE DETECTED  LABBENZ NONE DETECTED  AMPHETMU NONE DETECTED  THCU NONE DETECTED  LABBARB NONE DETECTED    Alcohol Level  Recent Labs  Lab 12/20/21 1135  ETH <10    IMAGING past 24 hours CT ANGIO HEAD NECK W WO CM  Result Date: 12/21/2021 CLINICAL DATA:  Follow-up examination for stroke. EXAM: CT ANGIOGRAPHY HEAD AND NECK TECHNIQUE: Multidetector CT  imaging of the head and neck was performed using the standard protocol during bolus administration of intravenous contrast. Multiplanar CT image reconstructions and MIPs were obtained to evaluate the vascular anatomy. Carotid stenosis measurements (when applicable) are obtained utilizing NASCET criteria, using the distal internal carotid diameter as the denominator. RADIATION DOSE REDUCTION: This exam was performed according to the departmental dose-optimization program which includes automated exposure control, adjustment of the mA and/or kV according to patient size and/or use of iterative reconstruction technique. CONTRAST:  75mL OMNIPAQUE IOHEXOL 350 MG/ML SOLN COMPARISON:  CT from 12/20/2021. FINDINGS: CT HEAD FINDINGS Brain: Cerebral volume within normal limits. No acute intracranial hemorrhage or large vessel territory infarct. No mass lesion or midline shift. No hydrocephalus or extra-axial fluid collection. Vascular: No hyperdense vessel. Skull: Scalp soft tissues within normal limits.  Calvarium intact. Sinuses: Mild mucosal thickening about the ethmoidal air cells and maxillary sinuses. Mastoid air cells are clear. Orbits: Unremarkable. Review of the MIP images confirms the above findings CTA NECK FINDINGS Aortic arch: Normal caliber with standard branching pattern. No stenosis. Right carotid system: Right common and internal carotid arteries widely patent without stenosis, dissection  or occlusion. Left carotid system: Left common and internal carotid arteries widely patent without stenosis, dissection or occlusion. Vertebral arteries: Both vertebral arteries arise from the subclavian arteries. No proximal subclavian artery stenosis. Both vertebral arteries widely patent without stenosis, dissection or occlusion. Skeleton: No discrete osseous lesions. Other neck: No other acute finding. Upper chest: No other acute finding. Review of the MIP images confirms the above findings CTA HEAD FINDINGS Anterior  circulation: Mild atheromatous change within the carotid siphons with associated mild stenosis on the left (series 11, image 124). No significant stenosis on the right. Left A1 patent. Hypoplastic right A1. Anterior communicating artery complex and anterior cerebral arteries patent without abnormality. No M1 stenosis. No proximal MCA branch occlusion. Posterior circulation: Both V4 segments patent without stenosis. Left vertebral dominant. Both PICA patent. Basilar diminutive without stenosis. Superior cerebral arteries patent bilaterally. Left PCA supplied via the basilar as well as a small left posterior communicating artery. Fetal type origin of the right PCA. PCAs well perfused or distal aspects without stenosis. Venous sinuses: Patent allowing for timing the contrast bolus. Anatomic variants: As above.  No aneurysm. Review of the MIP images confirms the above findings IMPRESSION: 1. Negative CTA of the head and neck for large vessel occlusion or other emergent finding. 2. Mild atheromatous change within the carotid siphons with associated mild stenosis on the left. No other significant atheromatous disease about the major arterial vasculature of the head and neck. 3. No other acute intracranial abnormality. Electronically Signed   By: Rise Mu M.D.   On: 12/21/2021 04:17   MR BRAIN WO CONTRAST  Result Date: 12/21/2021 CLINICAL DATA:  51 year old male with 3 days of right side neurologic deficits. EXAM: MRI HEAD WITHOUT CONTRAST TECHNIQUE: Multiplanar, multiecho pulse sequences of the brain and surrounding structures were obtained without intravenous contrast. COMPARISON:  Head CT 12/20/2021. CTA head and neck 0301 hours today. FINDINGS: Brain: No restricted diffusion to suggest acute infarction. No midline shift, mass effect, evidence of mass lesion, ventriculomegaly, extra-axial collection or acute intracranial hemorrhage. Cervicomedullary junction and pituitary are within normal limits.  Incidental choroid plexus cysts, normal variant. Largely normal for age gray and white matter signal throughout the brain. Minimal nonspecific subcortical white matter T2 and FLAIR hyperintensity. No cortical encephalomalacia or chronic cerebral blood products identified. Deep gray nuclei, brainstem and cerebellum are negative. Conspicuous DWI signal at the vertex is felt related to physiologic arachnoid granulations there. Symmetric and normal noncontrast appearance of the cavernous sinus. Grossly symmetric and normal cisternal 5th nerve segments. Vascular: Major intracranial vascular flow voids are preserved. Distal left vertebral artery appears dominant. Skull and upper cervical spine: Negative. Visualized bone marrow signal is within normal limits. Sinuses/Orbits: Negative orbits. Trace paranasal sinus mucosal thickening. Other: Mastoids are clear. Visible internal auditory structures appear normal. Normal stylomastoid foramina. Unremarkable parotid glands. Negative visible scalp and face. IMPRESSION: Normal for age noncontrast MRI appearance of the brain. Electronically Signed   By: Odessa Fleming M.D.   On: 12/21/2021 04:21   ECHOCARDIOGRAM COMPLETE  Result Date: 12/21/2021    ECHOCARDIOGRAM REPORT   Patient Name:   ELIJAN BAULT Date of Exam: 12/21/2021 Medical Rec #:  [id number]   Height:       70.0 in Accession #:    [id number]  Weight:       193.0 lb Date of Birth:  May 26, 1971  BSA:          2.056 m Patient Age:    50 years  BP:           125/89 mmHg Patient Gender: M           HR:           72 bpm. Exam Location:  Inpatient Procedure: 2D Echo and Saline Contrast Bubble Study Indications:    Stroke  History:        Patient has no prior history of Echocardiogram examinations.                 Risk Factors:Diabetes, Hypertension and Dyslipidemia.  Sonographer:    Devonne Doughty Referring Phys: [id number] CORTNEY E DE LA TORRE IMPRESSIONS  1. Left ventricular ejection fraction, by estimation, is 55 to 60%. The left  ventricle has normal function. The left ventricle has no regional wall motion abnormalities. There is mild left ventricular hypertrophy. Left ventricular diastolic parameters were normal.  2. Right ventricular systolic function is normal. The right ventricular size is normal.  3. The mitral valve is normal in structure. No evidence of mitral valve regurgitation.  4. The aortic valve is normal in structure. There is mild calcification of the aortic valve. Aortic valve regurgitation is not visualized. FINDINGS  Left Ventricle: Left ventricular ejection fraction, by estimation, is 55 to 60%. The left ventricle has normal function. The left ventricle has no regional wall motion abnormalities. The left ventricular internal cavity size was normal in size. There is  mild left ventricular hypertrophy. Left ventricular diastolic parameters were normal. Right Ventricle: The right ventricular size is normal. Right vetricular wall thickness was not well visualized. Right ventricular systolic function is normal. Left Atrium: Left atrial size was normal in size. Right Atrium: Right atrial size was normal in size. Pericardium: There is no evidence of pericardial effusion. Mitral Valve: The mitral valve is normal in structure. No evidence of mitral valve regurgitation. Tricuspid Valve: The tricuspid valve is normal in structure. Tricuspid valve regurgitation is trivial. Aortic Valve: The aortic valve is normal in structure. There is mild calcification of the aortic valve. Aortic valve regurgitation is not visualized. Aortic valve mean gradient measures 4.0 mmHg. Aortic valve peak gradient measures 7.1 mmHg. Aortic valve  area, by VTI measures 2.98 cm. Pulmonic Valve: The pulmonic valve was normal in structure. Pulmonic valve regurgitation is not visualized. Aorta: The aortic root and ascending aorta are structurally normal, with no evidence of dilitation. IAS/Shunts: The atrial septum is grossly normal. Agitated saline contrast  was given intravenously to evaluate for intracardiac shunting.  LEFT VENTRICLE PLAX 2D LVIDd:         3.80 cm   Diastology LVIDs:         2.60 cm   LV e' medial:    7.83 cm/s LV PW:         1.20 cm   LV E/e' medial:  8.4 LV IVS:        1.20 cm   LV e' lateral:   6.96 cm/s LVOT diam:     2.00 cm   LV E/e' lateral: 9.4 LV SV:         80 LV SV Index:   39 LVOT Area:     3.14 cm  RIGHT VENTRICLE             IVC RV Basal diam:  3.10 cm     IVC diam: 1.80 cm RV Mid diam:    2.70 cm RV S prime:     10.00 cm/s TAPSE (M-mode): 1.8 cm LEFT ATRIUM  Index        RIGHT ATRIUM           Index LA diam:        4.20 cm 2.04 cm/m   RA Area:     12.30 cm LA Vol (A2C):   40.6 ml 19.75 ml/m  RA Volume:   26.90 ml  13.08 ml/m LA Vol (A4C):   40.9 ml 19.89 ml/m LA Biplane Vol: 40.9 ml 19.89 ml/m  AORTIC VALVE                    PULMONIC VALVE AV Area (Vmax):    3.14 cm     PV Vmax:       1.02 m/s AV Area (Vmean):   3.09 cm     PV Peak grad:  4.2 mmHg AV Area (VTI):     2.98 cm AV Vmax:           133.00 cm/s AV Vmean:          90.800 cm/s AV VTI:            0.268 m AV Peak Grad:      7.1 mmHg AV Mean Grad:      4.0 mmHg LVOT Vmax:         133.00 cm/s LVOT Vmean:        89.400 cm/s LVOT VTI:          0.254 m LVOT/AV VTI ratio: 0.95  AORTA Ao Root diam: 3.00 cm Ao Asc diam:  2.80 cm MITRAL VALVE MV Area (PHT): 3.20 cm    SHUNTS MV Decel Time: 237 msec    Systemic VTI:  0.25 m MV E velocity: 65.60 cm/s  Systemic Diam: 2.00 cm MV A velocity: 66.40 cm/s MV E/A ratio:  0.99 Kristeen Miss MD Electronically signed by Kristeen Miss MD Signature Date/Time: 12/21/2021/1:58:11 PM    Final     PHYSICAL EXAM General:  Alert, well-nourished, well-developed patient in no acute distress Respiratory:  Regular, unlabored respirations on room air  NEURO:  Mental Status: AA&Ox3  Speech/Language: speech is without dysarthria or aphasia.  Naming, repetition, fluency, and comprehension intact.  Cranial Nerves:  II: PERRL. Visual  fields full.  III, IV, VI: EOMI. Eyelids elevate symmetrically.  V: Sensation is intact to light touch with diminished sensation on the right VII: Smile is symmetrical. Able to puff cheeks and raise eyebrows.  VIII: hearing intact to voice. IX, X:  Phonation is normal.  XII: tongue is midline without fasciculations. Motor: 5/5 strength to all muscle groups tested.  Tone: is normal and bulk is normal Sensation- Intact to light touch bilaterally. Extinction absent to light touch to DSS. Sharp/Dull   Vibration.  Subjective diminished sensation on right side of the face Coordination: FTN intact bilaterally.No drift.  Gait- deferred  NIHSS 0 premorbid modified Rankin 0 ABCD2 score 6 ASSESSMENT/PLAN Mr. Keny Armand is a 51 y.o. male with history of HTN, DM, HLD presenting with facial numbness and right arm numbness as well as liquids dripping out of the right side of his mouth which occurred while he was brushing his teeth on Sunday.  His symptoms have improved since then, but some residual decreased facial sensation remains.  MRI did not show an acute stroke.  Left brain TIA:  with right facial droop and face and arm paresthesias which have resolved  CT head No acute abnormality. CTA head & neck negative for LVO, mild atheromatous change within carotid siphons with  mild stenosis on the left MRI  normal for age brain MRI 2D Echo EF 55-60%, normal atrial septum LDL 13 Triglycerides 382 HgbA1c 7.8 VTE prophylaxis - lovenox    Diet   Diet heart healthy/carb modified Room service appropriate? Yes; Fluid consistency: Thin   No antithrombotic prior to admission, now on aspirin 81 mg daily and clopidogrel 75 mg daily.  Therapy recommendations:  no PT/SLP/OT Disposition:  pending, likely home  Hypertension Home meds:  lisinopril 10 mg daily Stable Permissive hypertension (OK if < 220/120) but gradually normalize in 5-7 days Long-term BP goal normotensive  Hyperlipidemia Home meds:   rosuvastatin 20 mg daily, resumed in hospital LDL 13, goal < 70 Add fenofibrate for elevated triglycerides  Continue statin at discharge  Diabetes type II Uncontrolled Home meds:  Ozempic 2 mg weekly, glipizide-metformin 5-500 BID HgbA1c 7.8, goal < 7.0 CBGs Recent Labs    12/20/21 1129 12/21/21 0824 12/21/21 1214  GLUCAP 140* 179* 266*    SSI  Other Stroke Risk Factors none  Other Active Problems none  Hospital day # 1  Cortney E Ernestina Columbia , MSN, AGACNP-BC [facility] See Amion for schedule and pager information 12/21/2021 2:24 PM   STROKE MD NOTE : I have personally obtained history,examined this patient, reviewed notes, independently viewed imaging studies, participated in medical decision making and plan of care.ROS completed by me personally and pertinent positives fully documented  I have made any additions or clarifications directly to the above note. Agree with note above.  Patient presented with transient right-sided numbness on Sunday which resolved anything much of it but yesterday he noticed a right facial droop and weakness as well as arm and facial numbness.  Symptoms started improving by the time he came to the hospital.  MRI is negative for acute stroke.  CT angio is negative for large vessel stenosis or occlusion but showed mild atheromatous changes in carotid siphons bilaterally with mild stenosis on the left.  His ABCD 2 square score is 6.  His LDL cholesterol is 13 and he is on a statin but triglycerides elevated at 382.  Hemoglobin A1c is elevated at 7.8.  Recommend aspirin and Plavix for 3 weeks followed by aspirin alone.  Patient may also benefit by consideration for participation in the [facility] stroke prevention study ( aspirin , Plavix and Asindexian versus aspirin, plavix and placebo ) .  He and his wife are given written information to review and decide.  Greater than 50% time during this 50-minute visit was spent on counseling and  coordination of care about his TIA discussion about stroke prevention treatment and answering questions.  Discussed with Dr. Lauraine Rinne, MD Medical Director [facility] Pager: [phone] 12/21/2021 3:07 PM   To contact Stroke Continuity provider, please refer to [email or web address]. After hours, contact General Neurology

## 2021-12-21 NOTE — Consult Note (Signed)
Neurology Consultation ?Reason for Consult: Right-sided numbness ?Referring Physician: Posey Pronto, V ? ?CC: Right-sided numbness ? ?History is obtained from: Patient ? ?HPI: Riley Shea is a 50 y.o. male with a history of hypertension, diabetes, hyperlipidemia who presents with right-sided numbness.  He initially had facial numbness that started last Sunday while brushing his teeth.  He noticed that things were drooping of the right side of his mouth.  Today, he had numbness of his right arm as well.  The arm numbness has improved, but he still has numbness of his right face. ? ?He denies visual change, vertigo, nausea, vomiting, other symptoms. ? ?LKW: Sunday ?tpa given?: no, outside window ?NIHSS: 1 ? ?ROS: A 14 point ROS was performed and is negative except as noted in the HPI.  ? ?Past Medical History:  ?Diagnosis Date  ? Diabetes mellitus without complication ([place])   ? Hyperlipidemia   ? Hypertension   ? ? ? ?History reviewed. No pertinent family history. ? ? ?Social History:  reports that he has never smoked. He has never used smokeless tobacco. He reports that he does not drink alcohol and does not use drugs. ? ? ?Exam: ?Current vital signs: ?BP 125/89 (BP Location: Right Arm)   Pulse 75   Temp 98.6 ?F (37 ?C) (Oral)   Resp 14   Ht 5\' 10"  (1.778 m)   Wt 87.5 kg   SpO2 95%   BMI 27.69 kg/m?  ?Vital signs in last 24 hours: ?Temp:  [98 ?F (36.7 ?C)-98.6 ?F (37 ?C)] 98.6 ?F (37 ?C) (04/13 0000) ?Pulse Rate:  [69-79] 75 (04/13 0000) ?Resp:  [13-20] 14 (04/12 2200) ?BP: (109-133)/(68-94) 125/89 (04/13 0000) ?SpO2:  [94 %-99 %] 95 % (04/13 0000) ?Weight:  [87.5 kg-91.2 kg] 87.5 kg (04/12 2315) ? ? ?Physical Exam  ?Constitutional: Appears well-developed and well-nourished.  ?Psych: Affect appropriate to situation ?Eyes: No scleral injection ?HENT: No OP obstruction ?MSK: no joint deformities.  ?Cardiovascular: Normal rate and regular rhythm.  ?Respiratory: Effort normal, non-labored breathing ?GI: Soft.  No  distension. There is no tenderness.  ?Skin: WDI ? ?Neuro: ?Mental Status: ?Patient is awake, alert, oriented to person, place, month, year, and situation. ?Patient is able to give a clear and coherent history. ?No signs of aphasia or neglect ?Cranial Nerves: ?II: Visual Fields are full. Pupils are equal, round, and reactive to light.   ?III,IV, VI: EOMI without ptosis or diploplia.  ?V: Facial sensation is diminished on the right ?VII: Facial movement is symmetric.  ?VIII: hearing is intact to voice ?X: Uvula elevates symmetrically ?XI: Shoulder shrug is symmetric. ?XII: tongue is midline without atrophy or fasciculations.  ?Motor: ?Tone is normal. Bulk is normal. 5/5 strength was present in all four extremities.  ?Sensory: ?Sensation is symmetric to light touch and temperature in the arms and legs. ?Deep Tendon Reflexes: ?2+ and symmetric in the biceps and patellae.  ?Plantars: ?Toes are downgoing bilaterally.  ?Cerebellar: ?FNF and HKS are intact bilaterally ? ? ? ? ? ?I have reviewed labs in epic and the results pertinent to this consultation are: ?Creatinine 0.79 ?UDS negative ? ?I have reviewed the images obtained: CT head - negative ? ?Impression: 51 year old male with right-sided facial numbness and transient right-sided arm numbness.  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  He will need to be admitted for secondary risk factor modifications.  I would start him on dual antiplatelet therapy in the short-term, he received 75 mg and I will complete a 300  mg load with 225 mg. ? ?Recommendations: ?- HgbA1c, fasting lipid panel ?- MRI of the brain without contrast ?- Frequent neuro checks ?- Echocardiogram ?- CTA head and neck ?- Prophylactic therapy-Antiplatelet med: Aspirin - dose 81mg  and plavix 75mg  daily after additional 225mg  load(already received 75mg ) ?- Risk factor modification ?- Telemetry monitoring ?- PT consult, OT consult, Speech consult ?- Stroke team to  follow ? ? ? ?Roland Rack, MD ?[facility] ?[phone] ? ?If 7pm- 7am, please page neurology on call as listed in [place]. ? ?

## 2021-12-21 NOTE — Progress Notes (Signed)
Mykell Rawl to be D/C'd Home per MD order.  Discussed with the patient and all questions fully answered. ? ?VSS, Skin clean, dry and intact without evidence of skin break down, no evidence of skin tears noted. ?IV catheter discontinued intact. Site without signs and symptoms of complications. Dressing and pressure applied. ? ?An After Visit Summary was printed and given to the patient. Patient received prescription. ? ?D/c education completed with patient/family including follow up instructions, medication list, d/c activities limitations if indicated, with other d/c instructions as indicated by MD - patient able to verbalize understanding, all questions fully answered.  ? ?Patient instructed to return to ED, call 911, or call MD for any changes in condition.  ? ?Patient escorted via WC, and D/C home via private auto. ? ?Selena Batten Norah Devin ?12/21/2021 4:44 PM  ?

## 2021-12-21 NOTE — Assessment & Plan Note (Signed)
Resume home lisinopril 10 mg daily. ?

## 2021-12-21 NOTE — Discharge Summary (Signed)
?                                                                                ? ?Riley Shea AUQ:[id number] DOB: 16-May-1971 DOA: 12/20/2021 ? ?PCP: Alferd Apa, MD ? ?Admit date: 12/20/2021  Discharge date: 12/21/2021 ? ?Admitted From: Home   Disposition:  Home ? ? ?Recommendations for Outpatient Follow-up:  ? ?Follow up with PCP in 1-2 weeks ? ?PCP Please obtain BMP/CBC, 2 view CXR in 1week,  (see Discharge instructions)  ? ?PCP Please follow up on the following pending results: Monitor A1c, lipid panel closely.  Outpatient neurology follow-up ? ? ?Home Health: None   ?Equipment/Devices: None  ?Consultations: Neuro ?Discharge Condition: Stable    ?CODE STATUS: Full    ?Diet Recommendation: Heart Healthy Low Carb ?  ? ?Chief Complaint  ?Patient presents with  ? Tingling  ?  ? ?Brief history of present illness from the day of admission and additional interim summary   ? ?51 y.o. male with medical history significant for type 2 diabetes, HTN, HLD who presented to the ED for evaluation of right facial and arm numbness/tingling. ? ?                                                               Hospital Course  ? ? ?Right facial tingling numbness.  Likely due to a small TIA, completely unremarkable MRI brain along with CTA head and neck and echocardiogram LDL was at goal, triglycerides were elevated hence fenofibrate added to statin, A1c is above goal and request PCP to monitor his glycemic control closely.  He was seen by the stroke team has been placed on aspirin and Plavix, Plavix for 3 weeks and aspirin indefinitely.  He has only minimal right-sided facial numbness at this time except that he has no focal deficits or functional deficits. ? ?2.  Dyslipidemia.  LDL at goal on present dose statin, triglycerides were elevated hence fenofibrate added. ? ?3.  Hypertension.  Stable on home medications. ? ?4.  DM type II.  In poor  control.  A1c above goal, for now Home regimen and follow with PCP closely.  Requested to do CBGs q. ACH S. ? ? ?Discharge diagnosis   ? ? ?Principal Problem: ?  Right sided numbness ?Active Problems: ?  Type 2 diabetes mellitus (HCC) ?  Hypertension associated with diabetes (HCC) ?  Hyperlipidemia associated with type 2 diabetes mellitus (HCC) ?  Stroke [facility]) ? ? ? ?Discharge instructions   ? ?Discharge Instructions   ? ? Discharge instructions   Complete by: As directed ?  ? Follow with Primary MD [name], [name], MD in 7 days  ? ?Get CBC, CMP, A1c, lipid panel-  checked next visit within 1 week by Primary MD   ? ?Activity: As tolerated with Full fall precautions use walker/cane & assistance as needed ? ?Disposition Home  ? ?Diet: Heart Healthy Low Carb, check CBGs q. ACH S ? ?Special  Instructions: If you have smoked or chewed Tobacco  in the last 2 yrs please stop smoking, stop any regular Alcohol  and or any Recreational drug use. ? ?On your next visit with your primary care physician please Get Medicines reviewed and adjusted. ? ?Please request your Prim.MD to go over all Hospital Tests and Procedure/Radiological results at the follow up, please get all Hospital records sent to your Prim MD by signing hospital release before you go home. ? ?If you experience worsening of your admission symptoms, develop shortness of breath, life threatening emergency, suicidal or homicidal thoughts you must seek medical attention immediately by calling 911 or calling your MD immediately  if symptoms less severe. ? ?You Must read complete instructions/literature along with all the possible adverse reactions/side effects for all the Medicines you take and that have been prescribed to you. Take any new Medicines after you have completely understood and accpet all the possible adverse reactions/side effects.  ? Increase activity slowly   Complete by: As directed ?  ? ?  ? ? ?Discharge Medications  ? ?Allergies as of 12/21/2021   ? ?    Reactions  ? Penicillins Other (See Comments)  ? As a Child  ? ?  ? ?  ?Medication List  ?  ? ?TAKE these medications   ? ?ALPHA LIPOIC ACID PO ?Take 1 tablet by mouth daily. ?  ?aspirin 81 MG EC tablet ?Take 1 tablet (81 mg total) by mouth daily. Swallow whole. ?Start taking on: December 22, 2021 ?  ?clopidogrel 75 MG tablet ?Commonly known as: PLAVIX ?Take 1 tablet (75 mg total) by mouth daily. ?Start taking on: December 22, 2021 ?  ?fenofibrate 54 MG tablet ?Take 1 tablet (54 mg total) by mouth daily. ?Start taking on: December 22, 2021 ?  ?gabapentin 100 MG capsule ?Commonly known as: NEURONTIN ?Take 100-300 mg by mouth 2 (two) times daily. ?  ?glipiZIDE-metformin 5-500 MG tablet ?Commonly known as: METAGLIP ?Take 2 tablets by mouth 2 (two) times daily. ?  ?lisinopril 10 MG tablet ?Commonly known as: ZESTRIL ?Take 10 mg by mouth daily. ?  ?Ozempic (2 MG/DOSE) 8 MG/3ML Sopn ?Generic drug: Semaglutide (2 MG/DOSE) ?Inject 2 mg into the skin every Sunday. ?  ?rosuvastatin 20 MG tablet ?Commonly known as: CRESTOR ?Take 20 mg by mouth daily. ?  ? ?  ? ? ? Follow-up Information   ? ? [name], Laurell RoofJoylin G, MD. Schedule an appointment as soon as possible for a visit in 1 week(s).   ?Specialty: Family Medicine ?Contact information: ?[address] ?[place] [address] ?[phone] ? ? ?  ?  ? ? [facility]. Schedule an appointment as soon as possible for a visit in 1 week(s).   ?Contact information: ?[address]     [address] ?[place] [id number] ?[phone] ? ?  ?  ? ?  ?  ? ?  ? ? ?Major procedures and Radiology Reports - PLEASE review detailed and final reports thoroughly  -    ?  ?CT ANGIO HEAD NECK W WO CM ? ?Result Date: 12/21/2021 ?CLINICAL DATA:  Follow-up examination for stroke. EXAM: CT ANGIOGRAPHY HEAD AND NECK TECHNIQUE: Multidetector CT imaging of the head and neck was performed using the standard protocol during bolus administration of intravenous contrast. Multiplanar CT image  reconstructions and MIPs were obtained to evaluate the vascular anatomy. Carotid stenosis measurements (when applicable) are obtained utilizing NASCET criteria, using the distal internal carotid diameter as the denominator. RADIATION DOSE  REDUCTION: This exam was performed according to the departmental dose-optimization program which includes automated exposure control, adjustment of the mA and/or kV according to patient size and/or use of iterative reconstruction technique. CONTRAST:  76mL OMNIPAQUE IOHEXOL 350 MG/ML SOLN COMPARISON:  CT from 12/20/2021. FINDINGS: CT HEAD FINDINGS Brain: Cerebral volume within normal limits. No acute intracranial hemorrhage or large vessel territory infarct. No mass lesion or midline shift. No hydrocephalus or extra-axial fluid collection. Vascular: No hyperdense vessel. Skull: Scalp soft tissues within normal limits.  Calvarium intact. Sinuses: Mild mucosal thickening about the ethmoidal air cells and maxillary sinuses. Mastoid air cells are clear. Orbits: Unremarkable. Review of the MIP images confirms the above findings CTA NECK FINDINGS Aortic arch: Normal caliber with standard branching pattern. No stenosis. Right carotid system: Right common and internal carotid arteries widely patent without stenosis, dissection or occlusion. Left carotid system: Left common and internal carotid arteries widely patent without stenosis, dissection or occlusion. Vertebral arteries: Both vertebral arteries arise from the subclavian arteries. No proximal subclavian artery stenosis. Both vertebral arteries widely patent without stenosis, dissection or occlusion. Skeleton: No discrete osseous lesions. Other neck: No other acute finding. Upper chest: No other acute finding. Review of the MIP images confirms the above findings CTA HEAD FINDINGS Anterior circulation: Mild atheromatous change within the carotid siphons with associated mild stenosis on the left (series 11, image 124). No significant  stenosis on the right. Left A1 patent. Hypoplastic right A1. Anterior communicating artery complex and anterior cerebral arteries patent without abnormality. No M1 stenosis. No proximal MCA branch occlu

## 2021-12-21 NOTE — Discharge Instructions (Addendum)
Follow with Primary MD [name], Mayford Knife, MD in 7 days  ? ?Get CBC, CMP, A1c, lipid panel-  checked next visit within 1 week by Primary MD   ? ?Activity: As tolerated with Full fall precautions use walker/cane & assistance as needed ? ?Disposition Home  ? ?Diet: Heart Healthy Low Carb, check CBGs q. ACH S ? ?Special Instructions: If you have smoked or chewed Tobacco  in the last 2 yrs please stop smoking, stop any regular Alcohol  and or any Recreational drug use. ? ?On your next visit with your primary care physician please Get Medicines reviewed and adjusted. ? ?Please request your Prim.MD to go over all Hospital Tests and Procedure/Radiological results at the follow up, please get all Hospital records sent to your Prim MD by signing hospital release before you go home. ? ?If you experience worsening of your admission symptoms, develop shortness of breath, life threatening emergency, suicidal or homicidal thoughts you must seek medical attention immediately by calling 911 or calling your MD immediately  if symptoms less severe. ? ?You Must read complete instructions/literature along with all the possible adverse reactions/side effects for all the Medicines you take and that have been prescribed to you. Take any new Medicines after you have completely understood and accpet all the possible adverse reactions/side effects.  ? ?  ?

## 2021-12-21 NOTE — Evaluation (Signed)
Occupational Therapy Evaluation ?Patient Details ?Name: Riley Shea ?MRN: [id number] ?DOB: 1971-09-06 ?Today's Date: 12/21/2021 ? ? ?History of Present Illness Riley Shea is a 51 y.o. male with medical history significant for type 2 diabetes, HTN, HLD who presented to the ED for evaluation of right facial and arm numbness/tingling.  ? ?Clinical Impression ?  ?MR. Riley Shea is a 51 year old man who demonstrates normal strength, ROM and coordination of upper extremities. He demonstrates independence with bed mobility and ADLs. Demonstrates ability to perform rapid alternating movement with bilateral upper extremities, perform finger to nose and finger to finger coordination. Reports sensation in upper extremity is now normal. Reports continued dull sensation on right side of face and still dribbling with drinking at times out of right corner of mouth. Patient has no OT needs at this time.  ?   ? ?Recommendations for follow up therapy are one component of a multi-disciplinary discharge planning process, led by the attending physician.  Recommendations may be updated based on patient status, additional functional criteria and insurance authorization.  ? ?Follow Up Recommendations ? No OT follow up  ?  ?Assistance Recommended at Discharge None  ?Patient can return home with the following   ? ?  ?Functional Status Assessment ? Patient has not had a recent decline in their functional status  ?Equipment Recommendations ? None recommended by OT  ?  ?Recommendations for Other Services   ? ? ?  ?Precautions / Restrictions Precautions ?Precautions: None  ? ?  ? ?Mobility Bed Mobility ?Overal bed mobility: Independent ?  ?  ?  ?  ?  ?  ?  ?  ? ?Transfers ?  ?  ?  ?  ?  ?  ?  ?  ?  ?  ?  ? ?  ?Balance Overall balance assessment: No apparent balance deficits (not formally assessed) (able to resist threee anterior nudges) ?  ?  ?  ?  ?  ?  ?  ?  ?  ?  ?  ?  ?  ?  ?  ?  ?  ?  ?   ? ?ADL either performed or assessed with clinical  judgement  ? ?ADL Overall ADL's : Independent ?  ?  ?  ?  ?  ?  ?  ?  ?  ?  ?  ?  ?  ?  ?  ?  ?  ?  ?  ?   ? ? ? ?Vision Baseline Vision/History: 0 No visual deficits ?   ?   ?Perception   ?  ?Praxis   ?  ? ?Pertinent Vitals/Pain Pain Assessment ?Pain Assessment: No/denies pain  ? ? ? ?Hand Dominance Right ?  ?Extremity/Trunk Assessment Upper Extremity Assessment ?Upper Extremity Assessment: RUE deficits/detail;LUE deficits/detail ?RUE Deficits / Details: WNL ROM. 5/5 shoulder, 4+/5 bicep, wrist 4+/5, 5/5 grip ?RUE Sensation: WNL ?RUE Coordination: WNL ?LUE Deficits / Details: WNL ROM and 5/5 strength ?LUE Sensation: WNL ?LUE Coordination: WNL ?  ?Lower Extremity Assessment ?Lower Extremity Assessment: Defer to PT evaluation ?  ?Cervical / Trunk Assessment ?Cervical / Trunk Assessment: Normal ?  ?Communication Communication ?Communication: No difficulties ?  ?Cognition Arousal/Alertness: Awake/alert ?Behavior During Therapy: [facility] for tasks assessed/performed ?Overall Cognitive Status: Within Functional Limits for tasks assessed ?  ?  ?  ?  ?  ?  ?  ?  ?  ?  ?  ?  ?  ?  ?  ?  ?  ?  ?  ?  General Comments    ? ?  ?Exercises   ?  ?Shoulder Instructions    ? ? ?Home Living Family/patient expects to be discharged to:: Private residence ?Living Arrangements: Spouse/significant other ?Available Help at Discharge: Family;Available PRN/intermittently ?Type of Home: House ?Home Access: Stairs to enter ?Entrance Stairs-Number of Steps: 5 ?  ?Home Layout: Multi-level;Able to live on main level with bedroom/bathroom ?  ?  ?Bathroom Shower/Tub: Walk-in shower ?  ?Bathroom Toilet: Standard ?  ?  ?Home Equipment: None ?  ?  ?  ? ?  ?Prior Functioning/Environment Prior Level of Function : Independent/Modified Independent ?  ?  ?  ?  ?  ?  ?Mobility Comments: independent -truck driver, short distance ?  ?  ? ?  ?  ?OT Problem List: Impaired sensation ?  ?   ?OT Treatment/Interventions:    ?  ?OT Goals(Current goals can be found in the  care plan section) Acute Rehab OT Goals ?OT Goal Formulation: All assessment and education complete, DC therapy  ?OT Frequency:   ?  ? ?Co-evaluation   ?  ?  ?  ?  ? ?  ?AM-PAC OT "6 Clicks" Daily Activity     ?Outcome Measure Help from another person eating meals?: None ?Help from another person taking care of personal grooming?: None ?Help from another person toileting, which includes using toliet, bedpan, or urinal?: None ?Help from another person bathing (including washing, rinsing, drying)?: None ?Help from another person to put on and taking off regular upper body clothing?: None ?Help from another person to put on and taking off regular lower body clothing?: None ?6 Click Score: 24 ?  ?End of Session   ? ?Activity Tolerance: Patient tolerated treatment well ?Patient left: in bed ? ?OT Visit Diagnosis: Other symptoms and signs involving the nervous system (R29.898)  ?              ?Time: [id number] ?OT Time Calculation (min): 8 min ?Charges:  OT General Charges ?$OT Visit: 1 Visit ?OT Evaluation ?$OT Eval Low Complexity: 1 Low ? ?Luiza Carranco, OTR/L ?Acute Care Rehab Services  ?Office [phone] ?Pager: [phone]  ? ?Cordera Stineman L Treshun Wold ?12/21/2021, 8:10 AM ?

## 2021-12-21 NOTE — Assessment & Plan Note (Signed)
Hold home oral meds, placed on SSI. ?

## 2021-12-21 NOTE — Assessment & Plan Note (Signed)
Continue rosuvastatin 20 mg daily. 

## 2021-12-21 NOTE — Progress Notes (Signed)
SLP Cancellation Note ? ?Patient Details ?Name: Riley Shea ?MRN: [id number] ?DOB: 08-06-1971 ? ? ?Cancelled treatment:       Reason Eval/Treat Not Completed: SLP screened, no needs identified, will sign off.  ? ? ?Angela Nevin, MA, CCC-SLP ?Speech Therapy ? ?

## 2021-12-21 NOTE — Progress Notes (Signed)
Echocardiogram ?2D Echocardiogram has been performed. ? ?Arlyss Gandy ?12/21/2021, 11:38 AM ?

## 2021-12-21 NOTE — Progress Notes (Signed)
?  Transition of Care (TOC) Screening Note ? ? ?Patient Details  ?Name: Riley Shea ?Date of Birth: 05-26-1971 ? ? ?Transition of Care (TOC) CM/SW Contact:    ?Mearl Latin, LCSW ?Phone Number: ?12/21/2021, 8:36 AM ? ? ? ?Transition of Care Department [facility]) has reviewed patient and no TOC needs have been identified at this time. We will continue to monitor patient advancement through interdisciplinary progression rounds. If new patient transition needs arise, please place a TOC consult. ? ? ?

## 2021-12-21 NOTE — Progress Notes (Signed)
Pt leaves in wheelchair with transport to CT at this time ?

## 2021-12-27 ENCOUNTER — Telehealth (HOSPITAL_COMMUNITY): Payer: Self-pay

## 2021-12-27 ENCOUNTER — Other Ambulatory Visit (HOSPITAL_COMMUNITY): Payer: Self-pay

## 2021-12-27 NOTE — Telephone Encounter (Signed)
Pharmacy Transitions of Care Follow-up Telephone Call ? ?Date of discharge: 12/21/2021  ?Discharge Diagnosis: Right facial tingling numbness.   ? ?How have you been since you were released from the hospital? Patient has been feeling fine since discharge  ? ?Medication changes made at discharge: ?START taking: ?Aspirin Low Dose (aspirin)  ?clopidogrel (PLAVIX)  ?fenofibrate  ? ?Medication changes verified by the patient? Yes ?  ? ?Medication Accessibility: ? ?Home Pharmacy: [facility] ? ?Was the patient provided with refills on discharged medications? Yes  ? ?Have all prescriptions been transferred from [facility] to home pharmacy? Yes  ? ?Is the patient able to afford medications? Yes - all generic prescriptions. ?  ? ?Medication Review: ?CLOPIDOGREL (PLAVIX) ?Clopidogrel 75 mg once daily.  ?Per MD, Plavix for 3 weeks and aspirin indefinitely.   ?- Reviewed potential DDIs with patient  ?- Advised patient of medications to avoid (NSAIDs, ASA)  ?- Educated that Tylenol (acetaminophen) will be the preferred analgesic to prevent risk of bleeding  ?- Emphasized importance of monitoring for signs and symptoms of bleeding (abnormal bruising, prolonged bleeding, nose bleeds, bleeding from gums, discolored urine, black tarry stools)  ?- Advised patient to alert all providers of anticoagulation therapy prior to starting a new medication or having a procedure  ? ?Follow-up Appointments: ? ?PCP Hospital f/u appt confirmed?  Scheduled to see Family Medicine on 01/01/2022.  ? ?If their condition worsens, is the pt aware to call PCP or go to the Emergency Dept.? Yes ? ?Final Patient Assessment: ?-Pt is doing well.  ?-Pt verbalized understanding of clopidogrel.  ?-Declined patient education ?-Pt has post discharge appointment and refill sent to [facility]. ? ? ?

## 2022-01-02 ENCOUNTER — Other Ambulatory Visit (HOSPITAL_COMMUNITY): Payer: Self-pay

## 2023-04-10 IMAGING — MR MR HEAD W/O CM
12 of 13 series · 44 of 48 positions shown · non-contrast
Comparison: Head CT 12/20/2021. CTA head and neck 0809 hours today.

CLINICAL DATA: 50-year-old male with 3 days of right side
neurologic deficits.

EXAM:
MRI HEAD WITHOUT CONTRAST
TECHNIQUE: Multiplanar, multiecho pulse sequences of the brain and surrounding
structures were obtained without intravenous contrast.

[Series 5: DWI · axial · 3.0mm · 0.96mm/px · z∈[-90,+81]mm · 7 of 116 slices shown (1 of 4)]
[im 1/116]
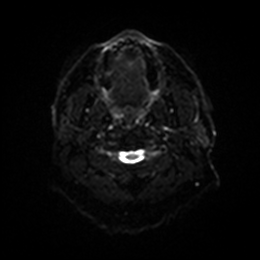
[im 20/116]
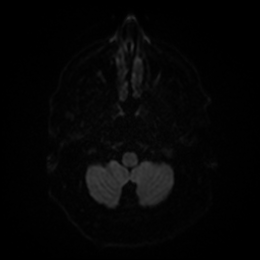
[im 39/116]
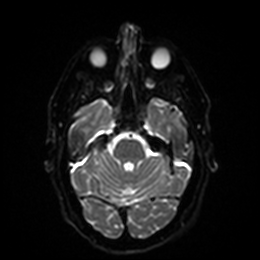
[im 58/116]
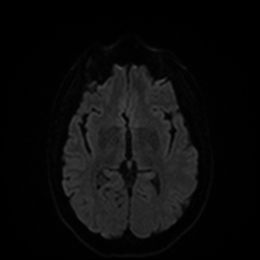
[im 77/116]
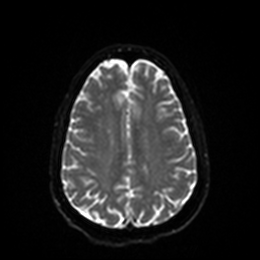
[im 96/116]
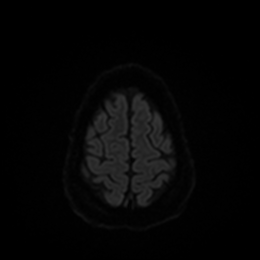
[im 116/116]
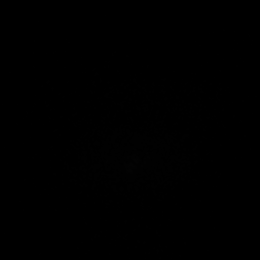

[Series 6: DWI · axial · 3.0mm · 0.96mm/px · z∈[-90,+78]mm · 4 of 54 slices shown (2 of 4)]
[im 1/54]
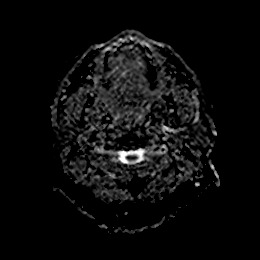
[im 18/54]
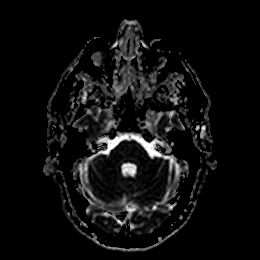
[im 36/54]
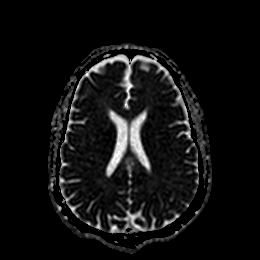
[im 54/54]
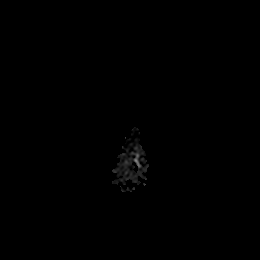

[Series 7: DWI · coronal · 4.0mm · 0.88mm/px · 6 of 88 slices shown (3 of 4)]
[im 1/88]
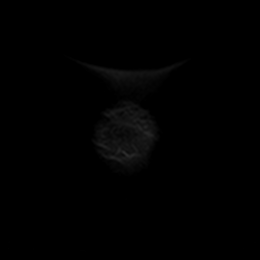
[im 18/88]
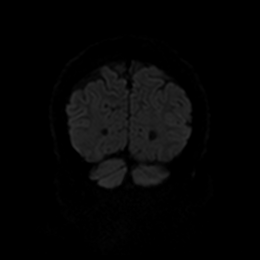
[im 35/88]
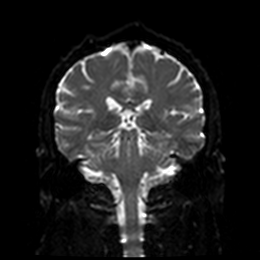
[im 53/88]
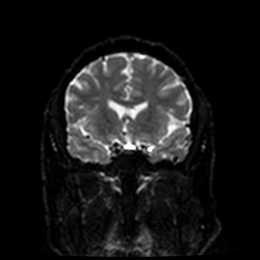
[im 70/88]
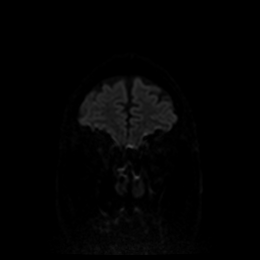
[im 88/88]
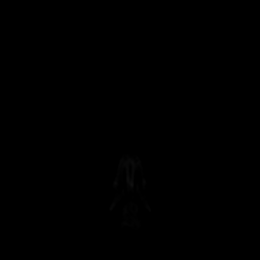

[Series 8: DWI · coronal · 4.0mm · 0.88mm/px · 3 of 44 slices shown (4 of 4)]
[im 1/44]
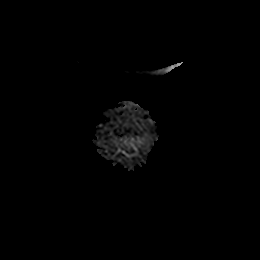
[im 22/44]
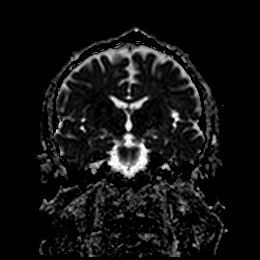
[im 44/44]
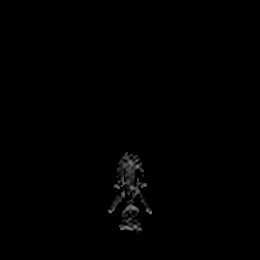

[Series 9: T1 · sagittal · 5.0mm · 0.78mm/px · 2 of 28 slices shown]
[im 1/28]
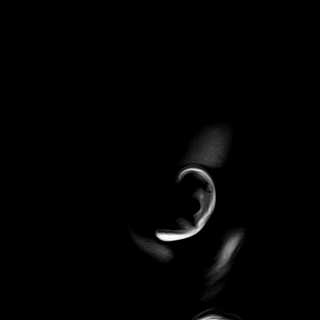
[im 28/28]
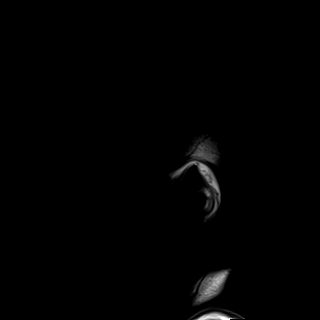

[Series 10: T2 · axial · 5.0mm · 0.78mm/px · z∈[-91,+83]mm · 2 of 30 slices shown (1 of 2)]
[im 1/30]
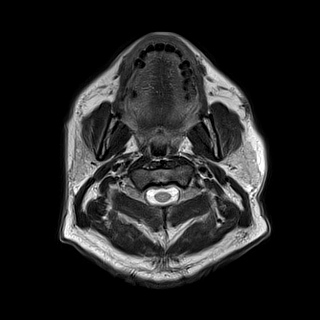
[im 30/30]
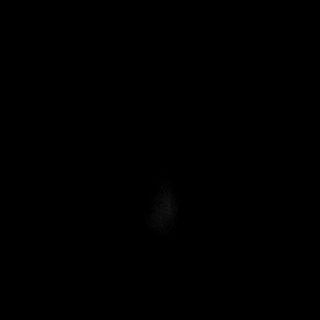

[Series 11: FLAIR · axial · 5.0mm · 0.49mm/px · z∈[-91,+83]mm · 2 of 30 slices shown]
[im 1/30]
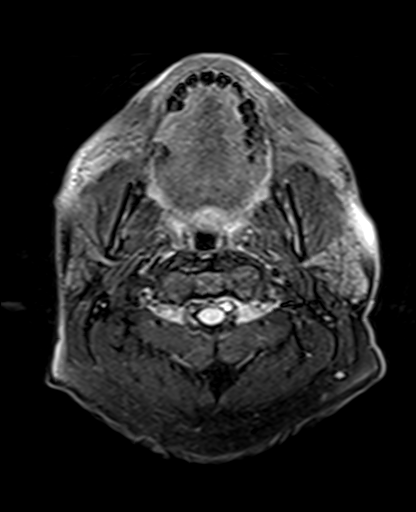
[im 30/30]
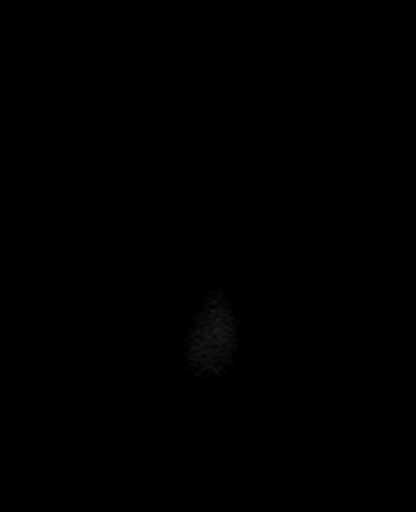

[Series 12: mag_images · axial · 3.0mm · 0.98mm/px · z∈[-93,+84]mm · 4 of 60 slices shown]
[im 1/60]
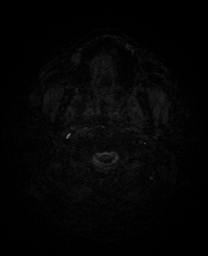
[im 20/60]
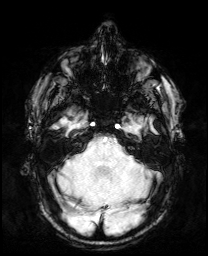
[im 40/60]
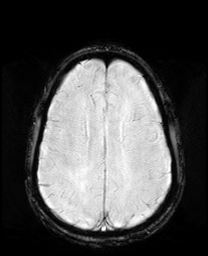
[im 60/60]
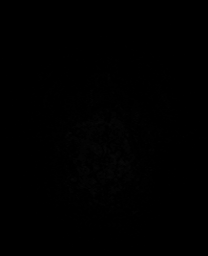

[Series 13: pha_images · axial · 3.0mm · 0.98mm/px · z∈[-93,+78]mm · 4 of 58 slices shown]
[im 1/58]
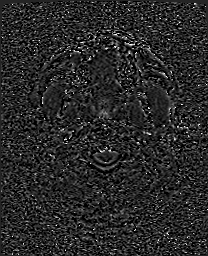
[im 20/58]
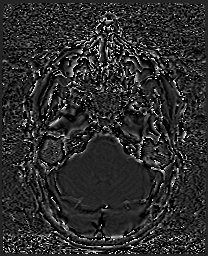
[im 39/58]
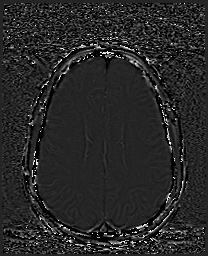
[im 58/58]
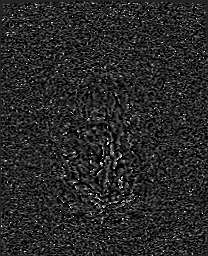

[Series 14: swi_images · axial · 3.0mm · 0.98mm/px · z∈[-93,+84]mm · 4 of 60 slices shown]
[im 1/60]
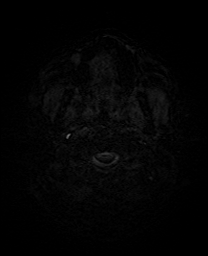
[im 20/60]
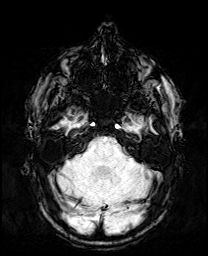
[im 40/60]
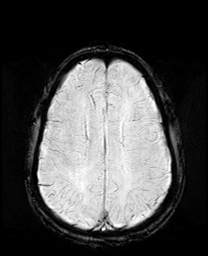
[im 60/60]
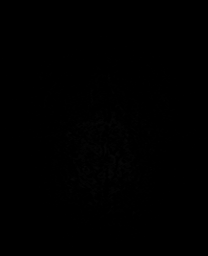

[Series 15: mip_images(sw) · axial · 24.0mm · 0.98mm/px · z∈[-82,+74]mm · 4 of 53 slices shown]
[im 1/53]
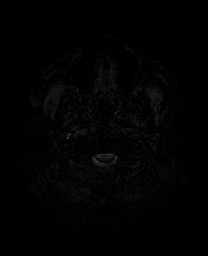
[im 18/53]
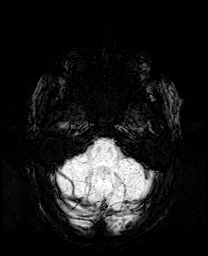
[im 35/53]
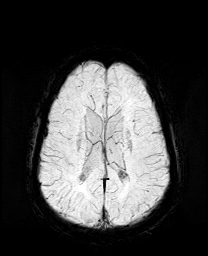
[im 53/53]
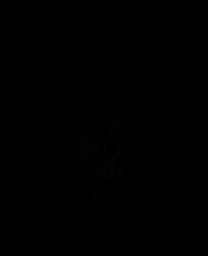

[Series 17: T2 · coronal · 5.0mm · 0.34mm/px · 2 of 36 slices shown (2 of 2)]
[im 1/36]
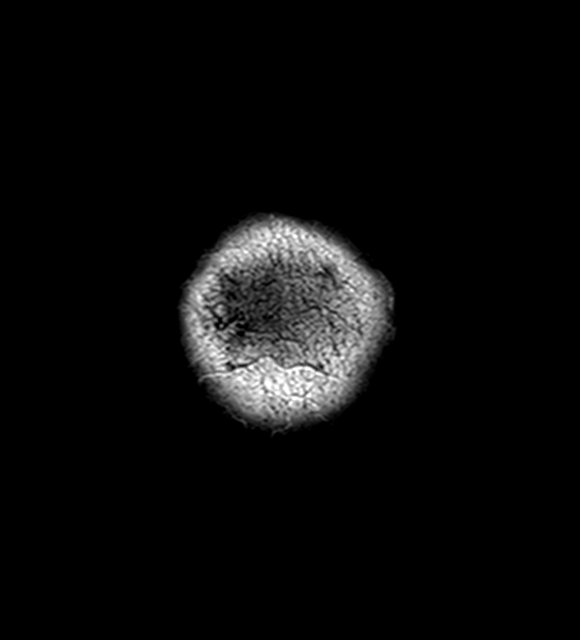
[im 36/36]
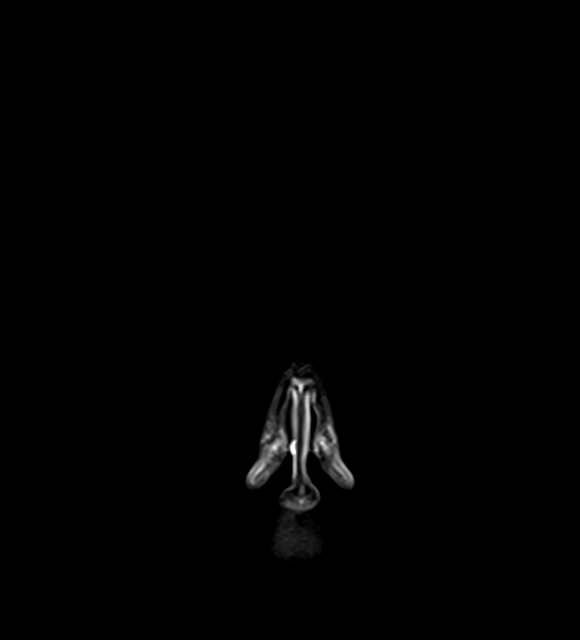

[44 of 48 positions shown; findings below may reference images not displayed]

FINDINGS: Brain: No restricted diffusion to suggest acute infarction. No
midline shift, mass effect, evidence of mass lesion,
ventriculomegaly, extra-axial collection or acute intracranial
hemorrhage. Cervicomedullary junction and pituitary are within
normal limits.

Incidental choroid plexus cysts, normal variant. Largely normal for
age gray and white matter signal throughout the brain. Minimal
nonspecific subcortical white matter T2 and FLAIR hyperintensity. No
cortical encephalomalacia or chronic cerebral blood products
identified. Deep gray nuclei, brainstem and cerebellum are negative.
Conspicuous DWI signal at the vertex is felt related to physiologic
arachnoid granulations there. Symmetric and normal noncontrast
appearance of the cavernous sinus. Grossly symmetric and normal
cisternal 5th nerve segments.

Vascular: Major intracranial vascular flow voids are preserved.
Distal left vertebral artery appears dominant.

Skull and upper cervical spine: Negative. Visualized bone marrow
signal is within normal limits.

Sinuses/Orbits: Negative orbits. Trace paranasal sinus mucosal
thickening.

Other: Mastoids are clear. Visible internal auditory structures
appear normal. Normal stylomastoid foramina. Unremarkable parotid
glands. Negative visible scalp and face.
IMPRESSION: Normal for age noncontrast MRI appearance of the brain.

## 2023-06-17 ENCOUNTER — Ambulatory Visit: Payer: BC Managed Care – PPO

## 2023-06-17 ENCOUNTER — Ambulatory Visit
Admission: EM | Admit: 2023-06-17 | Discharge: 2023-06-17 | Disposition: A | Discharge status: Home / Self Care | Payer: BC Managed Care – PPO | Attending: Nurse Practitioner | Admitting: Nurse Practitioner

## 2023-06-17 DIAGNOSIS — S93491A Sprain of other ligament of right ankle, initial encounter: Secondary | ICD-10-CM

## 2023-06-17 NOTE — ED Provider Notes (Signed)
[facility]    CSN: [id number] Arrival date & time: 06/17/23  0900      History   Chief Complaint No chief complaint on file.   HPI Riley Shea is a 52 y.o. male.   Patient presents today with right ankle pain.  Reports earlier today, he was stepping off the bottom step of his porch when he stepped in a hole and twisted his ankle.  He did not fall.  Since then, ankle has been painful and it is difficult to bear weight.  No new numbness or tingling in the toes.  Took ibuprofen which helped with the pain a little bit.    Past Medical History:  Diagnosis Date   Diabetes mellitus without complication (HCC)    Hyperlipidemia    Hypertension     Patient Active Problem List   Diagnosis Date Noted   Stroke (HCC) 12/21/2021   Right sided numbness 12/20/2021   Type 2 diabetes mellitus (HCC) 12/20/2021   Hypertension associated with diabetes (HCC) 12/20/2021   Hyperlipidemia associated with type 2 diabetes mellitus (HCC) 12/20/2021    History reviewed. No pertinent surgical history.     Home Medications    Prior to Admission medications   Medication Sig Start Date End Date Taking? Authorizing Provider  ALPHA LIPOIC ACID PO Take 1 tablet by mouth daily.    [provider]  aspirin 81 MG EC tablet Take 1 tablet (81 mg total) by mouth daily. Swallow whole. 12/22/21   Leroy Sea, MD  clopidogrel (PLAVIX) 75 MG tablet Take 1 tablet (75 mg total) by mouth daily. 12/22/21   Leroy Sea, MD  fenofibrate 54 MG tablet Take 1 tablet (54 mg total) by mouth daily. 12/22/21   Leroy Sea, MD  gabapentin (NEURONTIN) 100 MG capsule Take 100-300 mg by mouth 2 (two) times daily. 12/04/21   [provider]  glipiZIDE-metformin (METAGLIP) 5-500 MG tablet Take 2 tablets by mouth 2 (two) times daily. 11/24/21   [provider]  lisinopril (ZESTRIL) 10 MG tablet Take 10 mg by mouth daily. 11/24/21   [provider]  OZEMPIC, 2 MG/DOSE, 8  MG/3ML SOPN Inject 2 mg into the skin every Sunday. 12/07/21   [provider]  rosuvastatin (CRESTOR) 20 MG tablet Take 20 mg by mouth daily. 11/26/21   [provider]    Family History History reviewed. No pertinent family history.  Social History Social History   Tobacco Use   Smoking status: Never   Smokeless tobacco: Never  Vaping Use   Vaping status: Never Used  Substance Use Topics   Alcohol use: Never   Drug use: Never     Allergies   Penicillins   Review of Systems Review of Systems Per HPI  Physical Exam Triage Vital Signs ED Triage Vitals  Encounter Vitals Group     BP 06/17/23 1006 117/79     Systolic BP Percentile --      Diastolic BP Percentile --      Pulse Rate 06/17/23 1006 87     Resp 06/17/23 1006 16     Temp 06/17/23 1006 99.3 F (37.4 C)     Temp Source 06/17/23 1006 Oral     SpO2 06/17/23 1006 97 %     Weight --      Height --      Head Circumference --      Peak Flow --      Pain Score 06/17/23 1009 8  Pain Loc --      Pain Education --      Exclude from Growth Chart --    No data found.  Updated Vital Signs BP 117/79 (BP Location: Right Arm)   Pulse 87   Temp 99.3 F (37.4 C) (Oral)   Resp 16   SpO2 97%   Visual Acuity Right Eye Distance:   Left Eye Distance:   Bilateral Distance:    Right Eye Near:   Left Eye Near:    Bilateral Near:     Physical Exam Vitals and nursing note reviewed.  Constitutional:      General: He is not in acute distress.    Appearance: Normal appearance. He is not toxic-appearing.  Pulmonary:     Effort: Pulmonary effort is normal. No respiratory distress.  Musculoskeletal:     Comments: Inspection: swelling noted to right lateral malleolus; no bruising, obvious deformity or redness Palpation: tender to palpation to right ATFL; no obvious deformities palpated ROM: difficult to assess secondary to pain Strength: difficult to assess secondary to pain Neurovascular:  neurovascularly intact in distal right lower extremity   Skin:    General: Skin is warm and dry.     Capillary Refill: Capillary refill takes less than 2 seconds.     Coloration: Skin is not jaundiced or pale.     Findings: No erythema.  Neurological:     Mental Status: He is alert and oriented to person, place, and time.  Psychiatric:        Behavior: Behavior is cooperative.      UC Treatments / Results  Labs (all labs ordered are listed, but only abnormal results are displayed) Labs Reviewed - No data to display  EKG   Radiology DG Ankle Complete Right  Result Date: 06/17/2023 CLINICAL DATA:  stepped down off porch wrong fell ankle roll under him. Pain. EXAM: RIGHT ANKLE - COMPLETE 3+ VIEW COMPARISON:  None Available. FINDINGS: No acute fracture or dislocation. The ankle mortise is preserved. Base of the fifth metatarsal is intact. There is a small ankle joint effusion. Soft tissue edema is most prominent laterally. No erosive change. IMPRESSION: Soft tissue edema and small joint effusion. No acute fracture or dislocation. Electronically Signed   By: Narda Rutherford M.D.   On: 06/17/2023 11:35    Procedures Procedures (including critical care time)  Medications Ordered in UC Medications - No data to display  Initial Impression / Assessment and Plan / UC Course  I have reviewed the triage vital signs and the nursing notes.  Pertinent labs & imaging results that were available during my care of the patient were reviewed by me and considered in my medical decision making (see chart for details).   Patient is well-appearing, normotensive, afebrile, not tachycardic, not tachypneic, oxygenating well on room air.    1. Sprain of anterior talofibular ligament of right ankle, initial encounter No red flags on exam Xray is negative for fracture or bony abnormality Ace wrap applied and patient was given crutches if difficult to bear weight due to pain Recommended rest, ice,  compression, elevation, Tylenol for pain Work excuse provided Follow up with Podiatry if symptoms not improved with treatment and contact information given today   The patient was given the opportunity to ask questions.  All questions answered to their satisfaction.  The patient is in agreement to this plan.   Final Clinical Impressions(s) / UC Diagnoses   Final diagnoses:  Sprain of anterior talofibular ligament of right  ankle, initial encounter     Discharge Instructions      As we discussed, the x-ray has not been formally read by radiologist yet.  I do not see any obvious broken bones in your ankle and will contact you later today if we need to change treatment based on the formal xray read by the Radiologist.  Please wear the walking boot for the next couple of days (just not when driving).  If you are able to bear weight in the walking boot, it is okay to.  If you are not able to bear weight, use the crutches to get around.  Keep your foot elevated and apply ice 15 minutes on, 45 minutes off every hour while awake and continue Tylenol/ibuprofen as needed for pain.  Follow-up with podiatry if symptoms do not improve with this treatment; contact information has been provided.    ED Prescriptions   None    PDMP not reviewed this encounter.   Valentino Nose, NP 06/17/23 [phone]

## 2023-06-17 NOTE — ED Triage Notes (Signed)
Pt c/o right ankle injury occurred this morning when stepping down of front porch, felt his ankle give way and roll under him when stepping down. Swelling is present In the right ankle pt can bare weight but is limping on the ankle.

## 2023-06-17 NOTE — Discharge Instructions (Addendum)
As we discussed, the x-ray has not been formally read by radiologist yet.  I do not see any obvious broken bones in your ankle and will contact you later today if we need to change treatment based on the formal xray read by the Radiologist.  Please wear the walking boot for the next couple of days (just not when driving).  If you are able to bear weight in the walking boot, it is okay to.  If you are not able to bear weight, use the crutches to get around.  Keep your foot elevated and apply ice 15 minutes on, 45 minutes off every hour while awake and continue Tylenol/ibuprofen as needed for pain.  Follow-up with podiatry if symptoms do not improve with this treatment; contact information has been provided.

## 2023-06-19 ENCOUNTER — Encounter: Payer: Self-pay | Admitting: Podiatry

## 2023-06-19 ENCOUNTER — Ambulatory Visit: Payer: BC Managed Care – PPO | Admitting: Podiatry

## 2023-06-19 DIAGNOSIS — S93491A Sprain of other ligament of right ankle, initial encounter: Secondary | ICD-10-CM

## 2023-06-19 NOTE — Progress Notes (Signed)
  Subjective:  Patient ID: Riley Shea, male    DOB: 1970/11/15,  MRN: [id number]  Chief Complaint  Patient presents with   Foot Pain    RT foot - stepped of steps.  Rolled ankle.  Seen in urgent care on Monday.  Still Hurts, not taking as many pain meds. Hurts more on inside of ankle.    Had a stroke a year ago.    Discussed the use of AI scribe software for clinical note transcription with the patient, who gave verbal consent to proceed.  History of Present Illness   The patient, with a history of diabetes and neuropathy, presents with right ankle pain after stepping off a porch on Monday. He describes the pain as immediate and severe, almost to the point of tears. The patient recalls his foot rolling inwards (inversion) during the incident. He reports increased swelling since the incident and pain on the inside of the ankle when pressure is applied. The patient also mentions a history of multiple ankle injuries, with this being the most severe. He is due to go on a business trip involving a lot of walking and climbing stairs, which is concerning given his current condition.          Objective:    Physical Exam   MUSCULOSKELETAL: Pain on palpation of deltoid, ATFL, CFL, and syndesmosis. No proximal fibular pain. No gross instability. No ecchymosis. Moderate edema. No pain on palpation of fifth metatarsal or navicular.       No images are attached to the encounter.    Results   RADIOLOGY Right ankle X-ray: No fractures or bone fragments or avulsion, mortise is well aligned without shortening (06/17/2023)      Assessment:   1. High ankle sprain of right lower extremity, initial encounter      Plan:  Patient was evaluated and treated and all questions answered.  Assessment and Plan    High Ankle Sprain   Following a right ankle inversion injury on Monday, he presented with immediate pain, pain on palpation of the ATFL, CFL, and syndesmosis, moderate edema, but no proximal  fibular pain, gross instability, ecchymosis, or pain on the fifth metatarsal or navicular. X-ray results showed no fractures or avulsion injuries. We discussed the severity of high ankle sprains and the potential for partial ligament tears, emphasizing the need for immobilization and rest for healing. We will place him in a walking boot for immobilization for 4 weeks, use a compression sleeve to reduce swelling, and advise rest, ice, compression, and elevation (RICE). He will continue alternating Tylenol and Motrin for pain and inflammation and return in 4 weeks for a follow-up and potential initiation of physical therapy exercises.  Work Counselling psychologist   Given his job involves significant walking and driving, we discussed the potential for temporary light duty or office work. We will provide a letter for work accommodation due to the injury and advise him to discuss with HR and provide any necessary paperwork for completion.          Return in about 4 weeks (around 07/17/2023) for follow up ankle sprain.

## 2023-06-19 NOTE — Patient Instructions (Signed)
VISIT SUMMARY:  During your visit, we discussed your recent right ankle injury, which occurred when you stepped off a porch. You described the pain as severe and immediate, with increased swelling since the incident. You also mentioned a history of multiple ankle injuries. Given your upcoming business trip involving a lot of walking and climbing stairs, we discussed the need for rest and immobilization to aid healing.  YOUR PLAN:  -HIGH ANKLE SPRAIN: You have a high ankle sprain, which is a severe type of ankle injury that can involve partial tears of the ligaments. To help your ankle heal, we will put you in a walking boot for 4 weeks and use a compression sleeve to reduce swelling. You should also rest, apply ice, compress, and elevate your ankle (RICE). Continue taking Tylenol and Motrin for pain and inflammation. We will reassess your condition in 4 weeks and may start physical therapy exercises.  -WORK ACCOMMODATION: Given your job involves significant walking and driving, we discussed the possibility of temporary light duty or office work. We will provide a letter for work accommodation due to your injury. Please discuss this with your HR department and provide any necessary paperwork for completion.  INSTRUCTIONS:  Please remember to rest, apply ice, compress, and elevate your ankle (RICE). Continue taking Tylenol and Motrin as directed for pain and inflammation. Wear your walking boot and compression sleeve as instructed. We will see you again in 4 weeks for a follow-up and potential initiation of physical therapy exercises. In the meantime, please discuss work accommodation with your HR department.

## 2023-06-25 ENCOUNTER — Other Ambulatory Visit: Payer: Self-pay

## 2023-07-18 ENCOUNTER — Encounter: Payer: Self-pay | Admitting: Podiatry

## 2023-07-18 ENCOUNTER — Ambulatory Visit (INDEPENDENT_AMBULATORY_CARE_PROVIDER_SITE_OTHER): Payer: BC Managed Care – PPO | Admitting: Podiatry

## 2023-07-18 DIAGNOSIS — S93491A Sprain of other ligament of right ankle, initial encounter: Secondary | ICD-10-CM | POA: Diagnosis not present

## 2023-07-18 NOTE — Progress Notes (Signed)
  Subjective:  Patient ID: Riley Shea, male    DOB: 10/04/1970,  MRN: [id number]  Chief Complaint  Patient presents with   Routine Post Op    PATIENT STATES THAT HE IS WALKING ON PRETTY GOOD BUT IT IS STILL TENDER AND THE SWELLING COMES AND GOES AND HIS FOOT TURNS YELLOW . PATIENT TAKES MEDICATION FOR PAIN .     Discussed the use of AI scribe software for clinical note transcription with the patient, who gave verbal consent to proceed.  History of Present Illness   A 52 year old patient presents for a follow-up visit for a high ankle sprain in the right lower extremity. He reports discontinuing the use of a walking boot and feeling better overall. However, he still experiences some bruising in the leg and foot, specifically behind the third, fourth, and fifth toes. The patient notes mild tenderness in the areas of bruising but has good range of motion in the digit without significant pain. There is no pain in the proximal fibula, fifth metatarsal base, or navicular. However, there is slight tenderness over the syndesmosis, deltoid, and posterior Achilles, but no gross instability or palpable delve in the tendon.          Objective:    Physical Exam   MUSCULOSKELETAL: Ecchymosis in the mid leg and distal forefoot. Mild tenderness in the areas of ecchymosis. Good range of motion of the digits without major pain to palpation. No proximal fibular pain, fifth metatarsal base pain, navicular pain. Slight tenderness over the syndesmosis, deltoid, and posterior Achilles. No gross instability or palpable defect in the tendon.       No images are attached to the encounter.    Results          Assessment:   1. High ankle sprain of right lower extremity, initial encounter      Plan:  Patient was evaluated and treated and all questions answered.  Assessment and Plan    High Ankle Sprain, Right Lower Extremity   He is improving from a high ankle sprain in the right lower extremity, with  residual ecchymosis and mild tenderness over the syndesmosis, deltoid, and posterior Achilles areas. There is no gross instability or palpable defect in the tendon. We will initiate a home therapy plan and refer him to [facility] for formal therapy. He should continue weight bearing as tolerated. If there is not full improvement by 09/11/2023, we plan to perform an MRI and consider other options.          No follow-ups on file.

## 2023-07-19 ENCOUNTER — Telehealth: Payer: Self-pay

## 2023-07-19 NOTE — Telephone Encounter (Signed)
-----   Message from Edwin Cap sent at 07/18/2023 11:07 AM EST ----- Can you send this PT referral to [facility]?

## 2023-07-19 NOTE — Telephone Encounter (Signed)
Referral, demographics and office note faxed to [facility] Confirmation received
